# Patient Record
Sex: Male | Born: 1935 | Race: White | Hispanic: No | Marital: Married | Smoking: Never smoker
Health system: Southern US, Community
[De-identification: ages and names within clinical notes are randomized; demographics above are authoritative.]

## PROBLEM LIST (undated history)

## (undated) DIAGNOSIS — N200 Calculus of kidney: Secondary | ICD-10-CM

## (undated) DIAGNOSIS — Z972 Presence of dental prosthetic device (complete) (partial): Secondary | ICD-10-CM

## (undated) DIAGNOSIS — N201 Calculus of ureter: Secondary | ICD-10-CM

## (undated) DIAGNOSIS — Z860100 Personal history of colon polyps, unspecified: Secondary | ICD-10-CM

## (undated) DIAGNOSIS — R0609 Other forms of dyspnea: Secondary | ICD-10-CM

## (undated) DIAGNOSIS — Z974 Presence of external hearing-aid: Secondary | ICD-10-CM

## (undated) DIAGNOSIS — N400 Enlarged prostate without lower urinary tract symptoms: Secondary | ICD-10-CM

## (undated) DIAGNOSIS — Z9289 Personal history of other medical treatment: Secondary | ICD-10-CM

## (undated) DIAGNOSIS — R0600 Dyspnea, unspecified: Secondary | ICD-10-CM

## (undated) DIAGNOSIS — K5730 Diverticulosis of large intestine without perforation or abscess without bleeding: Secondary | ICD-10-CM

## (undated) DIAGNOSIS — I2510 Atherosclerotic heart disease of native coronary artery without angina pectoris: Secondary | ICD-10-CM

## (undated) DIAGNOSIS — E782 Mixed hyperlipidemia: Secondary | ICD-10-CM

## (undated) DIAGNOSIS — Z87442 Personal history of urinary calculi: Secondary | ICD-10-CM

## (undated) DIAGNOSIS — Z973 Presence of spectacles and contact lenses: Secondary | ICD-10-CM

## (undated) DIAGNOSIS — M1990 Unspecified osteoarthritis, unspecified site: Secondary | ICD-10-CM

## (undated) HISTORY — PX: CARDIAC CATHETERIZATION: SHX172

## (undated) HISTORY — DX: Mixed hyperlipidemia: E78.2

## (undated) HISTORY — DX: Calculus of kidney: N20.0

## (undated) HISTORY — DX: Unspecified osteoarthritis, unspecified site: M19.90

## (undated) HISTORY — DX: Personal history of colonic polyps: Z86.010

## (undated) HISTORY — DX: Atherosclerotic heart disease of native coronary artery without angina pectoris: I25.10

## (undated) HISTORY — DX: Personal history of colon polyps, unspecified: Z86.0100

## (undated) HISTORY — PX: TRANSTHORACIC ECHOCARDIOGRAM: SHX275

---

## 2000-09-17 HISTORY — PX: EXTRACORPOREAL SHOCK WAVE LITHOTRIPSY: SHX1557

## 2001-03-17 ENCOUNTER — Emergency Department (HOSPITAL_COMMUNITY): Admission: EM | Admit: 2001-03-17 | Discharge: 2001-03-17 | Payer: Self-pay | Admitting: *Deleted

## 2001-03-17 ENCOUNTER — Encounter: Payer: Self-pay | Admitting: *Deleted

## 2001-03-19 ENCOUNTER — Inpatient Hospital Stay (HOSPITAL_COMMUNITY): Admission: AD | Admit: 2001-03-19 | Discharge: 2001-03-20 | Payer: Self-pay | Admitting: Urology

## 2001-03-20 ENCOUNTER — Encounter: Payer: Self-pay | Admitting: Urology

## 2001-03-26 ENCOUNTER — Ambulatory Visit (HOSPITAL_COMMUNITY): Admission: RE | Admit: 2001-03-26 | Discharge: 2001-03-26 | Payer: Self-pay | Admitting: Urology

## 2001-03-26 ENCOUNTER — Encounter: Payer: Self-pay | Admitting: Urology

## 2001-04-04 ENCOUNTER — Ambulatory Visit (HOSPITAL_COMMUNITY): Admission: RE | Admit: 2001-04-04 | Discharge: 2001-04-04 | Payer: Self-pay | Admitting: Urology

## 2001-04-04 ENCOUNTER — Encounter: Payer: Self-pay | Admitting: Urology

## 2001-05-22 ENCOUNTER — Ambulatory Visit (HOSPITAL_COMMUNITY): Admission: RE | Admit: 2001-05-22 | Discharge: 2001-05-22 | Payer: Self-pay | Admitting: Urology

## 2001-05-22 ENCOUNTER — Encounter: Payer: Self-pay | Admitting: Urology

## 2001-06-03 ENCOUNTER — Ambulatory Visit (HOSPITAL_COMMUNITY): Admission: RE | Admit: 2001-06-03 | Discharge: 2001-06-03 | Payer: Self-pay | Admitting: General Surgery

## 2002-02-19 ENCOUNTER — Ambulatory Visit (HOSPITAL_COMMUNITY): Admission: RE | Admit: 2002-02-19 | Discharge: 2002-02-19 | Payer: Self-pay | Admitting: Family Medicine

## 2002-02-19 ENCOUNTER — Encounter: Payer: Self-pay | Admitting: Family Medicine

## 2002-02-27 ENCOUNTER — Ambulatory Visit (HOSPITAL_COMMUNITY): Admission: RE | Admit: 2002-02-27 | Discharge: 2002-02-27 | Payer: Self-pay | Admitting: Cardiology

## 2004-05-03 ENCOUNTER — Ambulatory Visit (HOSPITAL_COMMUNITY): Admission: RE | Admit: 2004-05-03 | Discharge: 2004-05-03 | Payer: Self-pay | Admitting: General Surgery

## 2006-06-04 ENCOUNTER — Ambulatory Visit (HOSPITAL_COMMUNITY): Admission: RE | Admit: 2006-06-04 | Discharge: 2006-06-04 | Payer: Self-pay | Admitting: Family Medicine

## 2008-08-18 ENCOUNTER — Ambulatory Visit (HOSPITAL_COMMUNITY): Admission: RE | Admit: 2008-08-18 | Discharge: 2008-08-18 | Payer: Self-pay | Admitting: Family Medicine

## 2010-07-18 ENCOUNTER — Encounter: Payer: Self-pay | Admitting: Cardiology

## 2010-10-16 ENCOUNTER — Ambulatory Visit (HOSPITAL_COMMUNITY)
Admission: RE | Admit: 2010-10-16 | Discharge: 2010-10-16 | Payer: Self-pay | Source: Home / Self Care | Attending: Urology | Admitting: Urology

## 2011-02-02 NOTE — H&P (Signed)
Jaime [name], Jaime [name]                            ACCOUNT NO.:  [id number]   MEDICAL RECORD NO.:  [id number]                   PATIENT TYPE:   LOCATION:                                       FACILITY:   PHYSICIAN:  Dalia Heading, M.D.               DATE OF BIRTH:  [birth year]   DATE OF ADMISSION:  DATE OF DISCHARGE:                                HISTORY & PHYSICAL   CHIEF COMPLAINT:  History of ascending colon polyp.   HISTORY OF PRESENT ILLNESS:  The patient is a 77 year old white male who is  referred for a followup colonoscopy.  He had a colonoscopy done in 2002  which revealed a ascending colon polyp which appeared to be benign.  He now  presents for a followup examination.  No new medical problems have been  noted.  He denies any hematochezia or melena.  There is no family history of  colon carcinoma.   PAST MEDICAL HISTORY:  Unremarkable.   PAST SURGICAL HISTORY:  As noted above.   CURRENT MEDICATIONS:  Lipitor.   ALLERGIES:  NO KNOWN DRUG ALLERGIES.   REVIEW OF SYSTEMS:  Noncontributory.   PHYSICAL EXAMINATION:  The patient is a well-developed, well-nourished white  male in no acute distress.  He is afebrile and vital signs are stable.  LUNGS:  Clear to auscultation with equal breath sounds bilaterally.  HEART:  Examination reveals a regular rate and rhythm without S3, S4, or  murmurs.  ABDOMEN:  Soft, nontender, nondistended.  No hepatosplenomegaly or masses  are noted.  RECTAL:  Examination was deferred to the procedure.   IMPRESSION:  History of colon polyps.   PLAN:  The patient is scheduled for a colonoscopy on May 03, 2004.  The  risks and benefits of the procedure including bleeding and perforation were  fully explained to the patient, gave informed consent.     ___________________________________________                                         Dalia Heading, M.D.   MAJ/MEDQ  D:  04/27/2004  T:  04/27/2004  Job:  [id number]   cc:   Patrica Duel,  M.D.  [address], [address]  [place]  [place] [address]  Fax: [phone]

## 2011-02-02 NOTE — Cardiovascular Report (Signed)
[place]  Patient:    Jaime [name], Jaime [name] Visit Number: [id number] MRN: [id number]          Service Type: CAT Location: [facility] 2856 [id number] Attending Physician:  Rollene Rotunda Dictated by:   Rollene Rotunda, M.D. [facility] Proc. Date: 02/27/02 Admit Date:  02/27/2002 Discharge Date: 02/27/2002   CC:         Jonell Cluck, M.D.  Thomas C. Daleen Squibb, M.D. [facility], [place], [place].   Cardiac Catheterization  DATE OF BIRTH:  [birth year]  PRIMARY CARE PHYSICIAN:  Jonell Cluck, M.D.  PROCEDURES:  Left heart catheterization/coronary arteriography.  INDICATIONS:  Evaluate patient with chest pain and progressive dyspnea on exertion with cardiovascular risk factors (786.51).  PROCEDURAL NOTE:  Left heart catheterization was performed via the right femoral artery.  The artery was cannulated using an anterior wall puncture.  A #6 French arterial sheath was inserted via the modified Seldinger technique. Preformed Judkins and a pigtail catheter were utilized.  The patient tolerated the procedure well and left the lab in stable condition.  RESULTS:  HEMODYNAMICS: 1. LV 118/12. 2. AO 112/62.  CORONARIES: 1. The left main was normal.  2. The LAD had diffuse luminal irregularities.  3. The circumflex was normal.  4. The right coronary artery was dominant and normal.  LEFT VENTRICULOGRAM:  A left ventriculogram was obtained in the RAO projection.  The EF was 65% with normal wall motion.  CONCLUSIONS:  Minimal coronary plaquing.  Normal left ventricular function.  PLAN:  No further cardiovascular workup is indicated.  The patient will follow up with Dr. Nobie Putnam for evaluation of his dyspnea. Dictated by:   Rollene Rotunda, M.D. [facility] Attending Physician:  Rollene Rotunda DD:  02/27/02 TD:  03/01/02 Job: [id number]

## 2011-05-16 ENCOUNTER — Ambulatory Visit (INDEPENDENT_AMBULATORY_CARE_PROVIDER_SITE_OTHER): Payer: 59 | Admitting: Cardiology

## 2011-05-16 ENCOUNTER — Encounter: Payer: Self-pay | Admitting: Cardiology

## 2011-05-16 DIAGNOSIS — R0602 Shortness of breath: Secondary | ICD-10-CM

## 2011-05-16 DIAGNOSIS — R42 Dizziness and giddiness: Secondary | ICD-10-CM

## 2011-05-16 DIAGNOSIS — I2510 Atherosclerotic heart disease of native coronary artery without angina pectoris: Secondary | ICD-10-CM

## 2011-05-16 DIAGNOSIS — R011 Cardiac murmur, unspecified: Secondary | ICD-10-CM

## 2011-05-16 DIAGNOSIS — E782 Mixed hyperlipidemia: Secondary | ICD-10-CM

## 2011-05-16 NOTE — Assessment & Plan Note (Signed)
2-D echocardiogram to be obtained to assess valvular structure and function.

## 2011-05-16 NOTE — Assessment & Plan Note (Signed)
On statin therapy, followed by Dr. Fusco. 

## 2011-05-16 NOTE — Progress Notes (Signed)
Clinical Summary [name] is a 77 y.o.male referred for cardiology consultation by Dr. Sherwood Gambler. He is here with his wife. Approximately one month ago he had an episode of near syncope while walking out to the mailbox. He states that he suddenly began to feel lightheaded without palpitations or chest pain, had to bend down to his knees to avoid passing out. His wife saw him from the window, and assisted him back to the house. He was reportedly nauseated after the event. He never had frank syncope, ultimately was back to baseline. He has had some brief episodes of what sound like orthostatic dizziness, no vertigo, but much less intense.  Otherwise he does describe dyspnea on exertion, however typically at higher levels of activity, NYHA class II at baseline. No exertional chest pain. He has a previously documented history of minor coronary atherosclerosis at catheterization 2003, no followup interval testing.  Followup ECG is reviewed below, overall nonspecific.   No Known Allergies  Medication list reviewed.  Past Medical History  Diagnosis Date  . Coronary atherosclerosis of native coronary artery     Minimal coronary atherosclerosis at catheterization 2003  . Mixed hyperlipidemia   . Osteoarthritis   . History of colonic polyps   . Nephrolithiasis     History reviewed. No pertinent past surgical history.  Family History  Problem Relation Age of Onset  . Cancer Father     Bone cancer    Social History [name] reports that he has never smoked. His smokeless tobacco use includes Chew. [name] reports that he does not drink alcohol.  Review of Systems Occasional "sinus headache," no obvious vertigo. No fevers or chills, no cough. Otherwise reviewed negative except as outlined.  Physical Examination Filed Vitals:   05/16/11 1352  BP: 133/70  Pulse: 70  Resp: 18  Normally nourished appearing male in no acute distress. HEENT: Conjunctiva and lids normal, oropharynx with  moist mucosa. Neck: Supple, no elevated JVP or carotid bruits, no thyromegaly. Lungs: Clear to auscultation, somewhat coarse breath sounds, nonlabored. Cardiac: Regular rate and rhythm with 2/6 systolic murmur heard at the base, no S3 gallop or rub. Abdomen: Soft, nontender, bowel sounds present. Skin: Warm and dry. Musculoskeletal: No kyphosis. Neuropsychiatric: Alert and oriented x3, mildly hard of hearing, affect appropriate.   ECG Normal sinus rhythm at 70 beats per minute with probable left atrial enlargement    Problem List and Plan

## 2011-05-16 NOTE — Patient Instructions (Signed)
Your physician recommends that you schedule a follow-up appointment in: after test we will call to discuss if follow up is needed  Your physician has recommended that you wear a holter monitor. Holter monitors are medical devices that record the heart's electrical activity. Doctors most often use these monitors to diagnose arrhythmias. Arrhythmias are problems with the speed or rhythm of the heartbeat. The monitor is a small, portable device. You can wear one while you do your normal daily activities. This is usually used to diagnose what is causing palpitations/syncope (passing out).  Your physician has requested that you have an exercise tolerance test. For further information please visit [email or web address] Please also follow instruction sheet, as given.  Your physician has requested that you have an echocardiogram. Echocardiography is a painless test that uses sound waves to create images of your heart. It provides your doctor with information about the size and shape of your heart and how well your heart's chambers and valves are working. This procedure takes approximately one hour. There are no restrictions for this procedure.

## 2011-05-16 NOTE — Assessment & Plan Note (Signed)
Minor coronary atherosclerosis noted at cardiac catheterization in 2003.

## 2011-05-16 NOTE — Assessment & Plan Note (Signed)
Not progressive, and noted mainly at higher levels of activity. No true exertional chest pain. In light of his other presenting complaints, a basic ischemic evaluation will be obtained via treadmill testing.

## 2011-05-16 NOTE — Assessment & Plan Note (Signed)
At this point etiology not clear. Almost sounds orthostatic, and patient is on Flomax which could be contributing. He was reportedly not objectively orthostatic at his primary care office visit however. He does not endorse any specific palpitations. Possibly an exertional component, although not consistently. He describes most of his transient dizziness occurring in the mornings, and the late evenings. We will further assess with a 24-hour Holter monitor.

## 2011-05-31 ENCOUNTER — Ambulatory Visit (HOSPITAL_COMMUNITY)
Admission: RE | Admit: 2011-05-31 | Discharge: 2011-05-31 | Disposition: A | Discharge status: Home / Self Care | Payer: Medicare HMO | Source: Ambulatory Visit | Attending: Cardiology | Admitting: Cardiology

## 2011-05-31 ENCOUNTER — Ambulatory Visit (INDEPENDENT_AMBULATORY_CARE_PROVIDER_SITE_OTHER): Payer: Medicare HMO | Admitting: *Deleted

## 2011-05-31 DIAGNOSIS — I359 Nonrheumatic aortic valve disorder, unspecified: Secondary | ICD-10-CM

## 2011-05-31 DIAGNOSIS — R42 Dizziness and giddiness: Secondary | ICD-10-CM

## 2011-05-31 DIAGNOSIS — R0609 Other forms of dyspnea: Secondary | ICD-10-CM | POA: Insufficient documentation

## 2011-05-31 DIAGNOSIS — R011 Cardiac murmur, unspecified: Secondary | ICD-10-CM

## 2011-05-31 DIAGNOSIS — E785 Hyperlipidemia, unspecified: Secondary | ICD-10-CM | POA: Insufficient documentation

## 2011-05-31 DIAGNOSIS — I2510 Atherosclerotic heart disease of native coronary artery without angina pectoris: Secondary | ICD-10-CM | POA: Insufficient documentation

## 2011-05-31 DIAGNOSIS — R0989 Other specified symptoms and signs involving the circulatory and respiratory systems: Secondary | ICD-10-CM | POA: Insufficient documentation

## 2011-05-31 DIAGNOSIS — R0602 Shortness of breath: Secondary | ICD-10-CM

## 2011-05-31 NOTE — Progress Notes (Addendum)
Stress Lab Nurses Notes - Jaime [name]  Jaime [name] 05/31/2011  Reason for doing test: Coronary artery disease and near syncope.  Type of test: Regular GTX  Nurse performing test: Parke Poisson, RN  MD performing test: R. Dietrich Pates  Family MD: Fusco  Test explained and consent signed: yes  IV started: No IV started  Symptoms: Fatigue  Treatment/Intervention: None  Reason test stopped: fatigue  Patient to return to Nuc. Med at : NA  Patient discharged: Home  Patient's Condition upon discharge was: stable  Comments: Peak BP 188/82 HR 155, recovery  BP 148/60 & HR 96.  Symptoms resolved in recovery.    [name] T  Graded Exercise Test-Interpretation      Graded exercise performed to a work load of 8.6 METs and a heart rate of 157, 108% of age-predicted maximum.  Exercise discontinued due to dyspnea and fatigue; no chest discomfort reported.  Blood pressure increased from a resting value of 135/72 to 190/80 at peak exercise.  No arrhythmias noted.  EKG: Normal sinus rhythm; left atrial abnormality; occasional fusion beat; otherwise normal. Stress EKG:  ST segment depression starting in the first minute of stage II of the protocol and progressing to a maximum of 1.5-2 mm of slowly upsloping and flat depression in the inferior leads as well as V4-V6.  ST segments normalized immediately after the cessation of exercise.  Impression: Adequate but abnormal graded exercise test revealing reasonably good exercise capacity, no chest discomfort or other ischemic symptoms, a borderline hypertensive blood pressure response and an electrocardiographic response consistent with myocardial ischemia, but with very rapid resolution after the cessation of exercise suggesting a possible false positive electrocardiographic response.  Other findings as noted.  [place] Bing, M.D.

## 2011-05-31 NOTE — Progress Notes (Signed)
*  PRELIMINARY RESULTS* Echocardiogram 2D Echocardiogram has been performed.  Jaime [name] 05/31/2011, 10:10 AM

## 2011-05-31 NOTE — Progress Notes (Signed)
*  PRELIMINARY RESULTS* Echocardiogram 24H Holter Monitor has been performed.  Jaime [name] 05/31/2011, 12:26 PM

## 2011-06-05 NOTE — Procedures (Signed)
[name], Jaime                ACCOUNT NO.:  [id number]  MEDICAL RECORD NO.:  [id number]  LOCATION:                                 FACILITY:  PHYSICIAN:  Gerrit Friends. Dietrich Pates, MD, FACCDATE OF BIRTH:  02-Jul-1933  DATE OF PROCEDURE: DATE OF DISCHARGE:  06/01/2011                               HOLTER MONITOR   REFERRING PHYSICIAN:  Jonelle Sidle, MD  CLINICAL DATA:  A 77 year old gentleman with palpitations. 1. Continuous electrocardiographic recording was maintained for 24     hours during which the predominant rhythm was normal sinus with     minimal sinus tachycardia reaching a maximal rate of 112 bpm. 2. No PVCs nor other significant arrhythmias were recorded. 3. Occasional PACs occurred at an average rate of 10 per hour. 4. No significant ST-segment elevation or depression was identified. 5. A diary of activity was returned, but no symptoms were reported.     Gerrit Friends. Dietrich Pates, MD, [facility]     RMR/MEDQ  D:  06/05/2011  T:  06/05/2011  Job:  [id number]

## 2012-02-29 ENCOUNTER — Other Ambulatory Visit (HOSPITAL_COMMUNITY): Payer: Self-pay | Admitting: Internal Medicine

## 2012-02-29 DIAGNOSIS — K7689 Other specified diseases of liver: Secondary | ICD-10-CM

## 2012-03-05 ENCOUNTER — Ambulatory Visit (HOSPITAL_COMMUNITY)
Admission: RE | Admit: 2012-03-05 | Discharge: 2012-03-05 | Disposition: A | Discharge status: Home / Self Care | Payer: Medicare Other | Source: Ambulatory Visit | Attending: Internal Medicine | Admitting: Internal Medicine

## 2012-03-05 DIAGNOSIS — R748 Abnormal levels of other serum enzymes: Secondary | ICD-10-CM | POA: Insufficient documentation

## 2012-03-05 DIAGNOSIS — K7689 Other specified diseases of liver: Secondary | ICD-10-CM | POA: Insufficient documentation

## 2012-03-12 ENCOUNTER — Telehealth: Payer: Self-pay

## 2012-03-12 NOTE — Telephone Encounter (Signed)
A message had been left to call pt to schedule appt. [facility] supposed had sent a referral. I have not received it. I called and LMOM for [facility] to fax the referral to me.) [facility] for pt to call to be triaged.

## 2012-03-13 ENCOUNTER — Encounter (INDEPENDENT_AMBULATORY_CARE_PROVIDER_SITE_OTHER): Payer: Self-pay | Admitting: *Deleted

## 2012-03-13 NOTE — Telephone Encounter (Signed)
Called [facility] to inquire about the referral. They said that the referral was sent to Dr. Karilyn Cota per pt request.

## 2012-03-19 ENCOUNTER — Encounter (INDEPENDENT_AMBULATORY_CARE_PROVIDER_SITE_OTHER): Payer: Self-pay | Admitting: *Deleted

## 2012-03-25 ENCOUNTER — Encounter (INDEPENDENT_AMBULATORY_CARE_PROVIDER_SITE_OTHER): Payer: Self-pay | Admitting: Internal Medicine

## 2012-03-25 ENCOUNTER — Ambulatory Visit (INDEPENDENT_AMBULATORY_CARE_PROVIDER_SITE_OTHER): Payer: Medicare Other | Admitting: Internal Medicine

## 2012-03-25 VITALS — BP 152/58 | HR 80 | Temp 97.7°F | Ht 68.0 in | Wt 168.6 lb

## 2012-03-25 DIAGNOSIS — R17 Unspecified jaundice: Secondary | ICD-10-CM

## 2012-03-25 NOTE — Progress Notes (Signed)
Subjective:     Patient ID: Jaime [name], male   DOB: 1932/12/16, 78 y.o.   MRN: [id number]  HPI Referred to our office for elevated bilirubin. He was seen by Dr. Phillips Odor for muscle cramp and pain in his legs. Presently being evaluated by [facility].  Cmet revealed an elevated bilirubin 2.1 with normal AST and ALT.  Appetite is okay.  Weight loss 10-12 pounds since May unintentional. No abdominal pain.  He denies jaundice. He has a BM about every other day or every 2-3 days. No melena or bright red rectal bleeding. 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 No abnormality identified within the  visualized portions.   Review of Systems Current Outpatient Prescriptions  Medication Sig Dispense Refill  . aspirin 81 MG tablet Take 81 mg by mouth daily.        . calcium-vitamin D (OSCAL WITH D) 500-200 MG-UNIT per tablet Take 1 tablet by mouth daily.        . Cholecalciferol (VITAMIN D-3 PO) Take 1,200 Units by mouth daily.        . fish oil-omega-3 fatty acids 1000 MG capsule Take 2 g by mouth daily.        . multivitamin (ONE-A-DAY MEN'S) TABS Take 1 tablet by mouth daily.        . simvastatin (ZOCOR) 40 MG tablet       . Tamsulosin HCl (FLOMAX) 0.4 MG CAPS        Past Medical History  Diagnosis Date  . Coronary atherosclerosis of native coronary artery     Minimal coronary atherosclerosis at catheterization 2003  . Mixed hyperlipidemia   . Osteoarthritis   . History of colonic polyps   . Nephrolithiasis    History reviewed. No pertinent past surgical history. History   Social History  . Marital Status: Married    Spouse Name: Jaime [name]    Number of Children: 3  . Years of Education: N/A   Occupational History  . Retired     Naval architect   Social History Main Topics  . Smoking  status: Never Smoker   . Smokeless tobacco: Current User    Types: Chew  . Alcohol Use: No  . Drug Use: No  . Sexually Active: Not on file   Other Topics Concern  . Not on file   Social History Narrative  . No narrative on file   Family Status  Relation Status Death Age  . Father Deceased     Bone cancer  . Mother Deceased     CAD age 56  . Sister Alive     good health   No Known Allergies     Objective:   Physical Exam Filed Vitals:   03/25/12 1539  Height: 5\' 8"  (1.727 m)  Weight: 168 lb 9.6 oz (76.476 kg)  Alert and oriented. Skin warm and dry. Oral mucosa is moist.   . Sclera anicteric, conjunctivae is pink. Thyroid not enlarged. No cervical lymphadenopathy. Lungs clear. Heart regular rate and rhythm.  Abdomen is soft. Bowel sounds are positive. No hepatomegaly. No abdominal masses felt. No tenderness.  No edema to lower extremities.        Assessment:    Elevated bilirubin. Suspect Gilbert's syndrome. Bilirubin elevated in 2011 at 1.9.  I discussed this case with  Dr. Karilyn Cota,    Plan:   Hepatic function. Further recommendations to follow.     CC Dr. Sherwood Gambler

## 2012-03-25 NOTE — Patient Instructions (Addendum)
Repeat Hepatic function. Further recommendations to follow once labs are back.

## 2012-03-26 ENCOUNTER — Ambulatory Visit (HOSPITAL_COMMUNITY)
Admission: RE | Admit: 2012-03-26 | Discharge: 2012-03-26 | Disposition: A | Discharge status: Home / Self Care | Payer: Medicare Other | Source: Ambulatory Visit | Attending: Physical Medicine and Rehabilitation | Admitting: Physical Medicine and Rehabilitation

## 2012-03-26 ENCOUNTER — Telehealth (INDEPENDENT_AMBULATORY_CARE_PROVIDER_SITE_OTHER): Payer: Self-pay | Admitting: *Deleted

## 2012-03-26 DIAGNOSIS — R262 Difficulty in walking, not elsewhere classified: Secondary | ICD-10-CM | POA: Insufficient documentation

## 2012-03-26 DIAGNOSIS — IMO0001 Reserved for inherently not codable concepts without codable children: Secondary | ICD-10-CM | POA: Insufficient documentation

## 2012-03-26 DIAGNOSIS — M545 Low back pain, unspecified: Secondary | ICD-10-CM | POA: Insufficient documentation

## 2012-03-26 DIAGNOSIS — M6281 Muscle weakness (generalized): Secondary | ICD-10-CM | POA: Insufficient documentation

## 2012-03-26 DIAGNOSIS — R7401 Elevation of levels of liver transaminase levels: Secondary | ICD-10-CM

## 2012-03-26 LAB — HEPATIC FUNCTION PANEL
ALT: 41 U/L (ref 0–53)
Alkaline Phosphatase: 59 U/L (ref 39–117)
Indirect Bilirubin: 0.8 mg/dL (ref 0.0–0.9)
Total Protein: 6.7 g/dL (ref 6.0–8.3)

## 2012-03-26 NOTE — Telephone Encounter (Signed)
Per Delrae Rend the patient will need to have Hepatic Profile in 3 weeks. Noted for July 31,2013.

## 2012-03-26 NOTE — Evaluation (Signed)
Physical Therapy Evaluation  Patient Details  Name: Jaime [name] MRN: [id number] Date of Birth: 07-07-1933  Today's Date: 03/26/2012 Time: 1310-1350 PT Time Calculation (min): 40 min  Visit#: 1  of 8   Re-eval:  04/25/2012 Assessment Diagnosis:  (radicular low back pain) Next MD Visit: 04/11/2012 Prior Therapy: none  Authorization: [employer] medic[employer]  Past Medical History:  Past Medical History  Diagnosis Date  . Coronary atherosclerosis of native coronary artery     Minimal coronary atherosclerosis at catheterization 2003  . Mixed hyperlipidemia   . Osteoarthritis   . History of colonic polyps   . Nephrolithiasis    Past Surgical History: No past surgical history on file.  Subjective Symptoms/Limitations Symptoms: Jaime [name] has had difficutly with back pain that radiates to the mid calf level that started in May.  He has been referred to physical therapy to try and resolve his pain and improve his functional ability.   How long can you sit comfortably?: The patient has no difficulty sitting. How long can you stand comfortably?: The patient states that he is has increased pain after just a minute of standing. How long can you walk comfortably?: The patient has began using a cane since May.  He states that he is able to walk for less than five minutes; anymore and he begins to have increased pain. Special Tests: No difficulty sleeping. Pain Assessment Currently in Pain?: Yes Pain Score:  (worst pain in the past week has been a 6/10 best 0/10) Pain Location: Back Pain Orientation: Right Pain Type: Chronic pain;Neuropathic pain Pain Onset: More than a month ago Pain Frequency: Intermittent Pain Relieving Factors: sitting  Effect of Pain on Daily Activities: increases pain   Prior Functioning  Home Living Lives With: Spouse Type of Home: House Home Access: Stairs to enter Secretary/administrator of Steps: 2 Prior Function Driving: Yes Vocation: Retired Leisure:  Hobbies-yes (Comment) Comments: mowing yards.  Pt was using a walking mowing now he uses a  Chief Strategy Officer due to increased pain when walking.  Cognition/Observation Cognition Overall Cognitive Status: Appears within functional limits for tasks assessed Arousal/Alertness: Awake/alert   Assessment RLE Strength Right Hip Flexion: 5/5 Right Hip Extension: 3+/5 Right Hip ABduction: 5/5 Right Hip ADduction: 5/5 Right Knee Flexion: 5/5 Right Knee Extension: 5/5 Right Ankle Dorsiflexion: 5/5 LLE Strength Left Hip Flexion: 5/5 Left Hip Extension: 3+/5 Left Hip ABduction: 5/5 Left Hip ADduction: 5/5 Left Knee Flexion: 5/5 Left Knee Extension: 5/5 Left Ankle Dorsiflexion: 5/5 Lumbar AROM Lumbar Flexion: decreased 25% pain upon return Lumbar Extension: wfl increased leg pain Lumbar - Right Side Bend: wnl increased R leg pain Lumbar - Left Side Bend: wnl slight increased pain. Lumbar - Right Rotation: wnl Lumbar - Left Rotation: decreased 20 %  Exercise/Treatments Mobility/Balance  Posture/Postural Control Posture/Postural Control: Postural limitations Postural Limitations: flat back   Stretches Active Hamstring Stretch: 3 reps;30 seconds Single Knee to Chest Stretch: 3 reps;30 seconds   Supine Ab Set: 10 reps Bridge: 10 reps   Physical Therapy Assessment and Plan PT Assessment and Plan Clinical Impression Statement: Pt with low back and R radicular sx to calf level who will benefit from skilled PT to relieve his symptoms and return him to prior functional level. Rehab Potential: Good PT Frequency: Min 2X/week PT Duration: 4 weeks PT Treatment/Interventions: Therapeutic exercise;Gait training;Modalities PT Plan: Next treatment begin trunk rotation, Prone extension and mechanical traction at max 50% body weight; min 30# less, hold 45 relax 15 x 15  Goals Home Exercise Program Pt will Perform Home Exercise Program: Independently PT Short Term Goals Time to Complete  Short Term Goals: 2 weeks PT Short Term Goal 1: no leg pain PT Short Term Goal 2: ROM wnl to allow normalized motion PT Long Term Goals Time to Complete Long Term Goals: 4 weeks PT Long Term Goal 1: Pt ambulating without cane PT Long Term Goal 2: Pt hip extension strength to be wnl Long Term Goal 3: Pt pain to be no more than a 1 throughout the day Long Term Goal 4: Pt to be able to walk for over an hour without increased pain  Problem List Patient Active Problem List  Diagnosis  . Shortness of breath  . Undiagnosed cardiac murmurs  . Dizziness and giddiness  . Mixed hyperlipidemia  . Coronary atherosclerosis of native coronary artery  . Elevated bilirubin  . Difficulty in walking    PT - End of Session Activity Tolerance: Patient tolerated treatment well General Behavior During Session: [facility] for tasks performed Cognition: [facility] for tasks performed PT Plan of Care PT Home Exercise Plan: given Consulted and Agree with Plan of Care: Patient    Jaime [name] 03/26/2012, 2:00 PM  Physician Documentation Your signature is required to indicate approval of the treatment plan as stated above.  Please sign and either send electronically or make a copy of this report for your files and return this physician signed original.   Please mark one 1.__approve of plan  2. ___approve of plan with the following conditions.   ______________________________                                                          _____________________ Physician Signature                                                                                                             Date

## 2012-04-01 ENCOUNTER — Ambulatory Visit (HOSPITAL_COMMUNITY)
Admission: RE | Admit: 2012-04-01 | Discharge: 2012-04-01 | Disposition: A | Discharge status: Home / Self Care | Payer: Medicare Other | Source: Ambulatory Visit | Attending: Internal Medicine | Admitting: Internal Medicine

## 2012-04-01 NOTE — Progress Notes (Signed)
Physical Therapy Treatment Patient Details  Name: Jaime [name] MRN: [id number] Date of Birth: 1933-08-29  Today's Date: 04/01/2012 Time: 1100-1149 PT Time Calculation (min): 49 min 24' therex 15' lumbar traction  Visit#: 2  of 8   Re-eval:      Authorization:    Authorization Time Period:    Authorization Visit#:   of     Subjective: Symptoms/Limitations Symptoms: Mr. Dowland c/o of 4/10 pain in R gluteal area radiating to knee. Patient did say that sometimes pain radiates to mid calf so today is a good day.  Precautions/Restrictions     Exercise/Treatments Mobility/Balance        Stretches Active Hamstring Stretch: 3 reps;30 seconds Single Knee to Chest Stretch: 3 reps;30 seconds Aerobic   Machines for Strengthening   Standing   Seated   Supine Ab Set: 10 reps Bridge: 10 reps Other Supine Lumbar Exercises: trunk rotation x 5 Sidelying   Prone  Other Prone Lumbar Exercises: prone extension x5 Quadruped    Modalities Modalities: Traction Traction Type of Traction: Lumbar Min (lbs): 45 Max (lbs): 75 Hold Time: 45" Rest Time: 15" Time: 15'  Physical Therapy Assessment and Plan PT Assessment and Plan Clinical Impression Statement: Patient completed all exercises well, including new ones, some requiring verbal and tactile cueing for proper technique. Patient did well with traction. radiating pain still present at end of trmt but decreased to 3/10.    Goals    Problem List Patient Active Problem List  Diagnosis  . Shortness of breath  . Undiagnosed cardiac murmurs  . Dizziness and giddiness  . Mixed hyperlipidemia  . Coronary atherosclerosis of native coronary artery  . Elevated bilirubin  . Difficulty in walking    General Behavior During Session: [facility] for tasks performed Cognition: [facility] for tasks performed  GP    Jaime [name] 04/01/2012, 11:53 AM

## 2012-04-02 ENCOUNTER — Ambulatory Visit (HOSPITAL_COMMUNITY)
Admission: RE | Admit: 2012-04-02 | Discharge: 2012-04-02 | Disposition: A | Discharge status: Home / Self Care | Payer: Medicare Other | Source: Ambulatory Visit | Attending: Internal Medicine | Admitting: Internal Medicine

## 2012-04-02 NOTE — Progress Notes (Addendum)
Physical Therapy Treatment Patient Details  Name: ROLLIN KOTOWSKI MRN: [id number] Date of Birth: 03-17-1933  Today's Date: 04/02/2012 Time: 0865-7846 PT Time Calculation (min): 38 min 15' therex 15' lumbar traction  Visit#: 3  of 8   Re-eval:      Authorization:    Authorization Time Period:    Authorization Visit#:   of     Subjective: Symptoms/Limitations Symptoms: Pt reports no pain at the moment. Pain is usually highest in the morning and them intermittent throughout the day radiating to calf RLE.  Precautions/Restrictions     Exercise/Treatments Active Hamstring Stretch: 3 reps;30 seconds Single Knee to Chest Stretch: 3 reps;30 seconds Ab Set: 10 reps Bridge: 10 reps     Modalities Modalities: Traction Traction Type of Traction: Lumbar Min (lbs): 50 Max (lbs): 80 Hold Time: 45 Rest Time: 15 Time: 15'  Physical Therapy Assessment and Plan PT Assessment and Plan Clinical Impression Statement: Patient tolerated increase in traction today ending treatment with no pain as he started. Exercises need verbal cueing for proper technique. Patient requested to leave session a few mins early due having an appt afterwards. Next treatment add additional lumbar stabilization exercises.  Goals    Problem List Patient Active Problem List  Diagnosis  . Shortness of breath  . Undiagnosed cardiac murmurs  . Dizziness and giddiness  . Mixed hyperlipidemia  . Coronary atherosclerosis of native coronary artery  . Elevated bilirubin  . Difficulty in walking    PT - End of Session Activity Tolerance: Patient tolerated treatment well General Behavior During Session: [facility] for tasks performed Cognition: [facility] for tasks performed  GP    Yarielys Beed ATKINSO 04/02/2012, 1:55 PM

## 2012-04-04 ENCOUNTER — Encounter (INDEPENDENT_AMBULATORY_CARE_PROVIDER_SITE_OTHER): Payer: Self-pay

## 2012-04-08 ENCOUNTER — Ambulatory Visit (HOSPITAL_COMMUNITY)
Admission: RE | Admit: 2012-04-08 | Discharge: 2012-04-08 | Disposition: A | Discharge status: Home / Self Care | Payer: Medicare Other | Source: Ambulatory Visit | Attending: Internal Medicine | Admitting: Internal Medicine

## 2012-04-08 DIAGNOSIS — R262 Difficulty in walking, not elsewhere classified: Secondary | ICD-10-CM

## 2012-04-08 NOTE — Progress Notes (Signed)
Physical Therapy Treatment Patient Details  Name: Jaime [name] MRN: [id number] Date of Birth: 10/20/1932  Today's Date: 04/08/2012 Time: 1300-1347 PT Time Calculation (min): 47 min  Visit#: 4  of 8   Re-eval: 04/22/12    Authorization:    Authorization Time Period:    Authorization Visit#:   of     Subjective: Symptoms/Limitations Symptoms: Pt states he is not having anymore leg pain.  In the mornings he has some hip pain that might take an hour or two to work out.  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 Pain Assessment Currently in Pain?: No/denies    Exercise/Treatments                                Stretches Prone on Elbows Stretch: 1 rep;60 seconds    Standing Scapular Retraction: Both;10 reps;Theraband Theraband Level (Scapular Retraction): Level 3 (Green) Row: Both;10 reps;Theraband Theraband Level (Row): Level 3 (Green) Shoulder Extension: Both;10 reps;Theraband   Supine Bridge: 15 reps Prone  Straight Leg Raise: 10 reps Other Prone Lumbar Exercises: heelsqueeze x10    Modalities Modalities: Traction Traction Type of Traction: Lumbar Min (lbs): 50 Max (lbs): 80 Hold Time: 45 Rest Time: 15 Time: 15  Physical Therapy Assessment and Plan PT Assessment and Plan Clinical Impression Statement: Pt pain continues to improve.  Added prone ex for both improved lumbar/hip ext strength and mobility of lumbar spine.   Rehab Potential: Good PT Plan: Pt to begin opposite arm/leg raise, body mechanics for lifting next treatment.    Goals    Problem List Patient Active Problem List  Diagnosis  . Shortness of breath  . Undiagnosed cardiac murmurs  . Dizziness and giddiness  . Mixed hyperlipidemia  . Coronary atherosclerosis of native coronary artery  . Elevated bilirubin  . Difficulty in walking    PT - End of Session Activity Tolerance: Patient tolerated treatment well General Behavior During Session: [facility] for tasks performed  GP     Glynn Yepes,CINDY 04/08/2012, 1:34 PM

## 2012-04-10 ENCOUNTER — Ambulatory Visit (HOSPITAL_COMMUNITY)
Admission: RE | Admit: 2012-04-10 | Discharge: 2012-04-10 | Disposition: A | Discharge status: Home / Self Care | Payer: Medicare Other | Source: Ambulatory Visit | Attending: Physical Medicine and Rehabilitation | Admitting: Physical Medicine and Rehabilitation

## 2012-04-10 NOTE — Progress Notes (Signed)
Physical Therapy Treatment Patient Details  Name: Jaime [name] MRN: [id number] Date of Birth: 1933-03-09  Today's Date: 04/10/2012 Time: 4098-1191 PT Time Calculation (min): 50 min  Visit#: 5  of 8   Re-eval: 04/22/12 Charges: Therex x 28' Traction x 17'  Subjective: Symptoms/Limitations Symptoms: Pt states that he has sporadic pain R hip but over all his pain is decreasing. Pain Assessment Currently in Pain?: No/denies (Currently) Pain Score:  (Has sporadic hip pain that pt rates at 3/10)   Exercise/Treatments Standing Functional Squats: 10 reps Scapular Retraction: 15 reps Theraband Level (Scapular Retraction): Level 3 (Green) Row: 15 reps Theraband Level (Row): Level 3 (Green) Shoulder Extension: 15 reps;Theraband Theraband Level (Shoulder Extension): Level 3 (Green)  Modalities Modalities: Traction Traction Type of Traction: Lumbar Min (lbs): 50 Max (lbs): 80 Hold Time: 45 Rest Time: 15 Time: 15 (17')  Physical Therapy Assessment and Plan PT Assessment and Plan Clinical Impression Statement: Pt requires multimodal cueing for proper form with scapular tband exercise. Began functional squats with education on proper body mechanics. Also began opposite arm/leg raise in prone with minimal difficulty and minimal need for cueing. Pt reports 0/10 pain at end of session. Rehab Potential: Good PT Plan: Continue to progress per PT POC.     Problem List Patient Active Problem List  Diagnosis  . Shortness of breath  . Undiagnosed cardiac murmurs  . Dizziness and giddiness  . Mixed hyperlipidemia  . Coronary atherosclerosis of native coronary artery  . Elevated bilirubin  . Difficulty in walking    PT - End of Session Activity Tolerance: Patient tolerated treatment well General Behavior During Session: [name] for tasks performed   [name], PTA 04/10/2012, 2:45 PM

## 2012-04-15 ENCOUNTER — Ambulatory Visit (HOSPITAL_COMMUNITY)
Admission: RE | Admit: 2012-04-15 | Discharge: 2012-04-15 | Disposition: A | Discharge status: Home / Self Care | Payer: Medicare Other | Source: Ambulatory Visit | Attending: Internal Medicine | Admitting: Internal Medicine

## 2012-04-15 DIAGNOSIS — R262 Difficulty in walking, not elsewhere classified: Secondary | ICD-10-CM

## 2012-04-15 NOTE — Evaluation (Signed)
Physical Therapy Treatment w/MMT and ROM Patient Details  Name: TEAGAN HEIDRICK MRN: [id number] Date of Birth: 15-May-1933  Today's Date: 04/15/2012 Time: 2951-8841 PT Time Calculation (min): 40 min Charges: 1 ROM, 1 MMT, 5' Self Care, 71' TE Visit#: 6  of 8   Re-eval: 04/22/12   Subjective Symptoms/Limitations Symptoms: I am feeling a lot better with less pain down the back of my leg.  I am not sure if it is the exercises or if it is just getting better on its own.  Pain Assessment Currently in Pain?: No/denies  Assessment RLE Strength Right Hip Extension: 4/5 (was 3+/5) LLE Strength Left Hip Extension: 5/5 (was 3+/5) Lumbar AROM Lumbar Flexion: WNL - no pain (decreased 25% pain upon return) Lumbar Extension: WNL - no pain (was wfl increased leg pain) Lumbar - Right Side Bend: WNL - No pain (was wnl increased R leg pain) Lumbar - Left Side Bend: WNL no pain (was wnl slight increased pain.) Lumbar - Right Rotation: WNL (wnl) Lumbar - Left Rotation: WNL  (was decreased 20 %)  Exercise/Treatments Stretches Active Hamstring Stretch: 3 reps;30 seconds (BLE) Single Knee to Chest Stretch: 3 reps;30 seconds (BLE) Double Knee to Chest Stretch: Other (comment) (10 reps 5 sec holds) Prone on Elbows Stretch: 1 rep;60 seconds Standing Functional Squats: 15 reps Forward Lunge: 5 reps;Limitations (BLE) Scapular Retraction: 15 reps Theraband Level (Scapular Retraction): Level 4 (Blue) Row: 15 reps Theraband Level (Row): Level 4 (Blue) Shoulder Extension: 15 reps Theraband Level (Shoulder Extension): Level 4 (Blue) Shoulder ADduction: AROM;Both;15 reps;Theraband Theraband Level (Shoulder Adduction): Level 4 (Blue) Other Standing Lumbar Exercises: R and L lateral side bending x5 each, back extension x10, Golf lifts 3x BLE Supine Ab Set: 5 reps Bent Knee Raise: 10 reps Bridge: 15 reps;5 seconds Prone  Single Arm Raise: Right;Left;5 reps Straight Leg Raise: 5 reps (R and L) Opposite  Arm/Leg Raise: Right arm/Left leg;Left arm/Right leg;5 reps Other Prone Lumbar Exercises: heelsqueeze 5x10  Physical Therapy Assessment and Plan PT Assessment and Plan Clinical Impression Statement: Re-assessed strength and ROM.  D/C lumbar traction today secondary to 0/10 pain or radiculopathy.  Discussed with patient if he remains pain free for next session will consider D/C secondary to currently 4/6 goals met.  Reviewed and updated HEP and added new exercises to encourage stability.  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   Rehab Potential: Good PT Plan: Consider D/C next visit if pt continues to have 0/10 rating for radicular pain w/o lumbar traction. Please provide w/blue tband and tband exercises    Goals Home Exercise Program Pt will Perform Home Exercise Program: Independently PT Goal: Perform Home Exercise Program - Progress: Partly met PT Short Term Goals Time to Complete Short Term Goals: 2 weeks PT Short Term Goal 1: no leg pain PT Short Term Goal 1 - Progress: Met PT Short Term Goal 2: ROM wnl to allow normalized motion PT Short Term Goal 2 - Progress: Met PT Long Term Goals Time to Complete Long Term Goals: 4 weeks PT Long Term Goal 1: Pt ambulating without cane PT Long Term Goal 1 - Progress: Met PT Long Term Goal 2: Pt hip extension strength to be wnl PT Long Term Goal 2 - Progress: Progressing toward goal Long Term Goal 3: Pt pain to be no more than a 1 throughout the day Long Term Goal 3 Progress: Met  Problem List Patient Active Problem List  Diagnosis  .  Shortness of breath  . Undiagnosed cardiac murmurs  . Dizziness and giddiness  . Mixed hyperlipidemia  . Coronary atherosclerosis of native coronary artery  . Elevated bilirubin  . Difficulty in walking    PT - End of Session Activity Tolerance: Patient tolerated treatment well General Behavior During Session: [facility] for tasks performed PT Plan  of Care PT Home Exercise Plan: see updated document PT Patient Instructions: Educated to continue to engage core during manual activities at home. Educated and had pt demostrate  Consulted and Agree with Plan of Care: Patient  Annett Fabian, PT 04/15/2012, 12:08 PM

## 2012-04-17 ENCOUNTER — Ambulatory Visit (HOSPITAL_COMMUNITY)
Admission: RE | Admit: 2012-04-17 | Discharge: 2012-04-17 | Disposition: A | Discharge status: Home / Self Care | Payer: Medicare Other | Source: Ambulatory Visit | Attending: Internal Medicine | Admitting: Internal Medicine

## 2012-04-17 DIAGNOSIS — M545 Low back pain, unspecified: Secondary | ICD-10-CM | POA: Insufficient documentation

## 2012-04-17 DIAGNOSIS — IMO0001 Reserved for inherently not codable concepts without codable children: Secondary | ICD-10-CM | POA: Insufficient documentation

## 2012-04-17 DIAGNOSIS — M6281 Muscle weakness (generalized): Secondary | ICD-10-CM | POA: Insufficient documentation

## 2012-04-17 DIAGNOSIS — R262 Difficulty in walking, not elsewhere classified: Secondary | ICD-10-CM | POA: Insufficient documentation

## 2012-04-17 NOTE — Progress Notes (Signed)
Physical Therapy Treatment Patient Details  Name: Jaime [name] MRN: [id number] Date of Birth: 1933-05-26  Today's Date: 04/17/2012 Time: 4098-1191 PT Time Calculation (min): 37 min  Visit#: 7  of 8   Re-eval: 04/22/12 Charges: Therex x 34'   Subjective: Symptoms/Limitations Symptoms: Pt reprots no radicular sx. Pain Assessment Currently in Pain?: No/denies Pain Score: 0-No pain  Objective: RLE Strength  Right Hip Extension: 4/5 (was 3+/5)  LLE Strength  Left Hip Extension: 5/5 (was 3+/5)  Lumbar AROM  Lumbar Flexion: WNL - no pain (decreased 25% pain upon return)  Lumbar Extension: WNL - no pain (was wfl increased leg pain)  Lumbar - Right Side Bend: WNL - No pain (was wnl increased R leg pain)  Lumbar - Left Side Bend: WNL no pain (was wnl slight increased pain.)  Lumbar - Right Rotation: WNL (wnl)  Lumbar - Left Rotation: WNL (was decreased 20 %) (Taken last session)  Exercise/Treatments Stretches Active Hamstring Stretch: 3 reps;30 seconds (BLE) Single Knee to Chest Stretch: 3 reps;30 seconds (BLE) Double Knee to Chest Stretch: Other (comment) (10x5") Standing Functional Squats: 15 reps Forward Lunge: 10 reps (BLE) Scapular Retraction: 15 reps Theraband Level (Scapular Retraction): Level 4 (Blue) Row: 15 reps Theraband Level (Row): Level 4 (Blue) Shoulder Extension: 15 reps Theraband Level (Shoulder Extension): Level 4 (Blue) Prone  Single Arm Raise: 10 reps Straight Leg Raise: 10 reps Opposite Arm/Leg Raise: 5 reps Other Prone Lumbar Exercises: heelsqueeze 5x10  Physical Therapy Assessment and Plan PT Assessment and Plan Clinical Impression Statement: Pt is without complaint throughout session. Radicular sx have subsided. Pt completes all therex without difficulty and minimal need for cueing. Pt comfortable with D/C to HEP. PT Plan: Recommend D/C to HEP.    Goals Home Exercise Program Pt will Perform Home Exercise Program: Independently PT Goal:  Perform Home Exercise Program - Progress: Met PT Short Term Goals Time to Complete Short Term Goals: 2 weeks PT Short Term Goal 1: no leg pain PT Short Term Goal 1 - Progress: Met PT Short Term Goal 2: ROM wnl to allow normalized motion PT Short Term Goal 2 - Progress: Met PT Long Term Goals Time to Complete Long Term Goals: 4 weeks PT Long Term Goal 1: Pt ambulating without cane PT Long Term Goal 1 - Progress: Met PT Long Term Goal 2: Pt hip extension strength to be wnl PT Long Term Goal 2 - Progress: Progressing toward goal Long Term Goal 3: Pt pain to be no more than a 1 throughout the day Long Term Goal 3 Progress: Met Long Term Goal 4: Pt to be able to walk for over an hour without increased pain  Long Term Goal 4 Progress: Met  Problem List Patient Active Problem List  Diagnosis  . Shortness of breath  . Undiagnosed cardiac murmurs  . Dizziness and giddiness  . Mixed hyperlipidemia  . Coronary atherosclerosis of native coronary artery  . Elevated bilirubin  . Difficulty in walking    PT - End of Session Activity Tolerance: Patient tolerated treatment well General Behavior During Session: [facility] for tasks performed   [name], PTA 04/17/2012, 2:24 PM

## 2012-04-22 ENCOUNTER — Ambulatory Visit (HOSPITAL_COMMUNITY): Payer: Medicare Other | Admitting: Physical Therapy

## 2012-04-24 ENCOUNTER — Ambulatory Visit (HOSPITAL_COMMUNITY): Payer: Medicare Other | Admitting: Physical Therapy

## 2012-04-28 ENCOUNTER — Encounter (INDEPENDENT_AMBULATORY_CARE_PROVIDER_SITE_OTHER): Payer: Medicare Other | Admitting: Internal Medicine

## 2012-04-28 ENCOUNTER — Other Ambulatory Visit (INDEPENDENT_AMBULATORY_CARE_PROVIDER_SITE_OTHER): Payer: Self-pay | Admitting: Internal Medicine

## 2012-04-29 ENCOUNTER — Telehealth (INDEPENDENT_AMBULATORY_CARE_PROVIDER_SITE_OTHER): Payer: Self-pay | Admitting: *Deleted

## 2012-04-29 DIAGNOSIS — E785 Hyperlipidemia, unspecified: Secondary | ICD-10-CM

## 2012-04-29 LAB — HEPATIC FUNCTION PANEL
ALT: 15 U/L (ref 0–53)
Albumin: 4.2 g/dL (ref 3.5–5.2)
Total Protein: 7 g/dL (ref 6.0–8.3)

## 2012-04-29 NOTE — Progress Notes (Signed)
Lab noted for November 13 Jaime [name] to make an appoinment

## 2012-04-29 NOTE — Telephone Encounter (Signed)
Per Dr.Rehman patient to have lab in 3 months

## 2012-04-30 ENCOUNTER — Telehealth (INDEPENDENT_AMBULATORY_CARE_PROVIDER_SITE_OTHER): Payer: Self-pay | Admitting: *Deleted

## 2012-04-30 NOTE — Telephone Encounter (Signed)
Open in error

## 2012-04-30 NOTE — Progress Notes (Signed)
Lab noted for 3 months  Patient needs office appointment for 3 months

## 2012-04-30 NOTE — Progress Notes (Signed)
Lab noted for 3 months Patient will need an appointment in 3 months Jaime [name])

## 2012-05-01 ENCOUNTER — Ambulatory Visit (INDEPENDENT_AMBULATORY_CARE_PROVIDER_SITE_OTHER): Payer: Medicare Other | Admitting: Internal Medicine

## 2012-05-13 NOTE — Telephone Encounter (Signed)
This has been addressed.

## 2012-06-12 NOTE — Patient Instructions (Addendum)
Patient was not actually seen

## 2012-06-12 NOTE — Progress Notes (Signed)
This encounter was created in error - please disregard.

## 2012-07-16 ENCOUNTER — Telehealth (INDEPENDENT_AMBULATORY_CARE_PROVIDER_SITE_OTHER): Payer: Self-pay | Admitting: *Deleted

## 2012-07-16 ENCOUNTER — Encounter (INDEPENDENT_AMBULATORY_CARE_PROVIDER_SITE_OTHER): Payer: Self-pay | Admitting: *Deleted

## 2012-07-16 DIAGNOSIS — E785 Hyperlipidemia, unspecified: Secondary | ICD-10-CM

## 2012-07-16 NOTE — Telephone Encounter (Signed)
Lab work due 

## 2012-07-30 LAB — HEPATIC FUNCTION PANEL
Bilirubin, Direct: 0.3 mg/dL (ref 0.0–0.3)
Total Bilirubin: 1.2 mg/dL (ref 0.3–1.2)

## 2012-08-04 ENCOUNTER — Ambulatory Visit (INDEPENDENT_AMBULATORY_CARE_PROVIDER_SITE_OTHER): Payer: Medicare Other | Admitting: Internal Medicine

## 2012-08-04 ENCOUNTER — Encounter (INDEPENDENT_AMBULATORY_CARE_PROVIDER_SITE_OTHER): Payer: Self-pay | Admitting: Internal Medicine

## 2012-08-04 DIAGNOSIS — R17 Unspecified jaundice: Secondary | ICD-10-CM

## 2012-08-04 NOTE — Progress Notes (Signed)
Subjective:     Patient ID: Jaime [name], male   DOB: 22-Sep-1932, 78 y.o.   MRN: [id number]  HPI   Feels fairly well.   Denies any jaundice. No abdominal pain.  His BMs are terrible sometimes. Sometimes he is constipated. No hx of etoh.  Appetite is good. H has actually gained 5 pounds since his last visit. Labs for this month were all normal.  Repeat labs by me in July were normal. Total bili was 1.0 02/28/2012 BUN 38, Creatinine 1.18, Total bilirubin 2.4, ALP 57, AST 25, ALT 29  07/18/10 Bili 1.9  Hepatic Function Panel     Component Value Date/Time   PROT 7.1 07/30/2012 0804   ALBUMIN 4.6 07/30/2012 0804   AST 22 07/30/2012 0804   ALT 21 07/30/2012 0804   ALKPHOS 61 07/30/2012 0804   BILITOT 1.2 07/30/2012 0804   BILIDIR 0.3 07/30/2012 0804   IBILI 0.9 07/30/2012 0804     Review of Systems see hpi Current Outpatient Prescriptions  Medication Sig Dispense Refill  . aspirin 81 MG tablet Take 81 mg by mouth daily.        . calcium-vitamin D (OSCAL WITH D) 500-200 MG-UNIT per tablet Take 1 tablet by mouth daily.        . Cholecalciferol (VITAMIN D-3 PO) Take 1,200 Units by mouth daily.        . fish oil-omega-3 fatty acids 1000 MG capsule Take 2 g by mouth daily.        . multivitamin (ONE-A-DAY MEN'S) TABS Take 1 tablet by mouth daily.        . simvastatin (ZOCOR) 40 MG tablet       . Tamsulosin HCl (FLOMAX) 0.4 MG CAPS        Past Medical History  Diagnosis Date  . Coronary atherosclerosis of native coronary artery     Minimal coronary atherosclerosis at catheterization 2003  . Mixed hyperlipidemia   . Osteoarthritis   . History of colonic polyps   . Nephrolithiasis     No past surgical history on file. No Known Allergies           Objective:   Physical ExamThere were no vitals filed for this visit. Alert and oriented. Skin warm and dry. Oral mucosa is moist.   . Sclera anicteric, conjunctivae is pink. Thyroid not enlarged. No cervical lymphadenopathy. Lungs  clear. Heart regular rate and rhythm.  Abdomen is soft. Bowel sounds are positive. No hepatomegaly. No abdominal masses felt. No tenderness.  No edema to lower extremities.       Assessment:   Elevated bilirubin. Bilirubin is normal at this time.     Plan:    If any problems, please call our office. May return on a prn basis.

## 2012-08-04 NOTE — Patient Instructions (Addendum)
OV prn. Any problems call our office

## 2015-06-27 ENCOUNTER — Other Ambulatory Visit (HOSPITAL_COMMUNITY): Payer: Self-pay | Admitting: Internal Medicine

## 2015-06-27 DIAGNOSIS — R945 Abnormal results of liver function studies: Secondary | ICD-10-CM

## 2015-07-01 ENCOUNTER — Ambulatory Visit (HOSPITAL_COMMUNITY)
Admission: RE | Admit: 2015-07-01 | Discharge: 2015-07-01 | Disposition: A | Discharge status: Home / Self Care | Payer: Medicare HMO | Source: Ambulatory Visit | Attending: Internal Medicine | Admitting: Internal Medicine

## 2015-07-01 DIAGNOSIS — R7989 Other specified abnormal findings of blood chemistry: Secondary | ICD-10-CM | POA: Diagnosis not present

## 2015-07-01 DIAGNOSIS — R945 Abnormal results of liver function studies: Secondary | ICD-10-CM | POA: Diagnosis not present

## 2015-11-30 DIAGNOSIS — R69 Illness, unspecified: Secondary | ICD-10-CM | POA: Diagnosis not present

## 2015-12-22 DIAGNOSIS — H57059 Tonic pupil, unspecified eye: Secondary | ICD-10-CM | POA: Diagnosis not present

## 2015-12-22 DIAGNOSIS — Z0100 Encounter for examination of eyes and vision without abnormal findings: Secondary | ICD-10-CM | POA: Diagnosis not present

## 2015-12-22 DIAGNOSIS — E7800 Pure hypercholesterolemia, unspecified: Secondary | ICD-10-CM | POA: Diagnosis not present

## 2015-12-22 DIAGNOSIS — H5200 Hypermetropia, unspecified eye: Secondary | ICD-10-CM | POA: Diagnosis not present

## 2015-12-22 DIAGNOSIS — H2510 Age-related nuclear cataract, unspecified eye: Secondary | ICD-10-CM | POA: Diagnosis not present

## 2016-06-27 DIAGNOSIS — N400 Enlarged prostate without lower urinary tract symptoms: Secondary | ICD-10-CM | POA: Diagnosis not present

## 2016-06-27 DIAGNOSIS — Z1389 Encounter for screening for other disorder: Secondary | ICD-10-CM | POA: Diagnosis not present

## 2016-06-27 DIAGNOSIS — E782 Mixed hyperlipidemia: Secondary | ICD-10-CM | POA: Diagnosis not present

## 2016-06-27 DIAGNOSIS — I10 Essential (primary) hypertension: Secondary | ICD-10-CM | POA: Diagnosis not present

## 2016-06-27 DIAGNOSIS — Z6827 Body mass index (BMI) 27.0-27.9, adult: Secondary | ICD-10-CM | POA: Diagnosis not present

## 2016-06-27 DIAGNOSIS — M1991 Primary osteoarthritis, unspecified site: Secondary | ICD-10-CM | POA: Diagnosis not present

## 2016-06-27 DIAGNOSIS — Z0000 Encounter for general adult medical examination without abnormal findings: Secondary | ICD-10-CM | POA: Diagnosis not present

## 2016-07-05 DIAGNOSIS — Z23 Encounter for immunization: Secondary | ICD-10-CM | POA: Diagnosis not present

## 2016-09-25 ENCOUNTER — Encounter: Payer: Self-pay | Admitting: Orthopedic Surgery

## 2016-09-25 ENCOUNTER — Ambulatory Visit (INDEPENDENT_AMBULATORY_CARE_PROVIDER_SITE_OTHER): Payer: Medicare HMO | Admitting: Orthopedic Surgery

## 2016-09-25 ENCOUNTER — Ambulatory Visit (INDEPENDENT_AMBULATORY_CARE_PROVIDER_SITE_OTHER): Payer: Medicare HMO

## 2016-09-25 VITALS — BP 153/70 | HR 69 | Wt 172.0 lb

## 2016-09-25 DIAGNOSIS — M7552 Bursitis of left shoulder: Secondary | ICD-10-CM

## 2016-09-25 DIAGNOSIS — M25512 Pain in left shoulder: Secondary | ICD-10-CM | POA: Diagnosis not present

## 2016-09-25 NOTE — Patient Instructions (Addendum)
You have received an injection of steroids into the joint. 15% of patients will have increased pain within the 24 hours postinjection.   This is transient and will go away.   We recommend that you use ice packs on the injection site for 20 minutes every 2 hours and extra strength Tylenol 2 tablets every 8 as needed until the pain resolves.  If you continue to have pain after taking the Tylenol and using the ice please call the office for further instructions.   Bursitis Introduction Bursitis is inflammation and irritation of a bursa, which is one of the small, fluid-filled sacs that cushion and protect the moving parts of your body. These sacs are located between bones and muscles, muscle attachments, or skin areas next to bones. A bursa protects these structures from the wear and tear that results from frequent movement. An inflamed bursa causes pain and swelling. Fluid may build up inside the sac. Bursitis is most common near joints, especially the knees, elbows, hips, and shoulders. What are the causes? Bursitis can be caused by:  Injury from:  A direct blow, like falling on your knee or elbow.  Overuse of a joint (repetitive stress).  Infection. This can happen if bacteria gets into a bursa through a cut or scrape near a joint.  Diseases that cause joint inflammation, such as gout and rheumatoid arthritis. What increases the risk? You may be at risk for bursitis if you:  Have a job or hobby that involves a lot of repetitive stress on your joints.  Have a condition that weakens your body's defense system (immune system), such as diabetes, cancer, or HIV.  Lift and reach overhead often.  Kneel or lean on hard surfaces often.  Run or walk often. What are the signs or symptoms? The most common signs and symptoms of bursitis are:  Pain that gets worse when you move the affected body part or put weight on it.  Inflammation.  Stiffness. Other signs and symptoms may  include:  Redness.  Tenderness.  Warmth.  Pain that continues after rest.  Fever and chills. This may occur in bursitis caused by infection. How is this diagnosed? Bursitis may be diagnosed by:  Medical history and physical exam.  MRI.  A procedure to drain fluid from the bursa with a needle (aspiration). The fluid may be checked for signs of infection or gout.  Blood tests to rule out other causes of inflammation. How is this treated? Bursitis can usually be treated at home with rest, ice, compression, and elevation (RICE). For mild bursitis, RICE treatment may be all you need. Other treatments may include:  Nonsteroidal anti-inflammatory drugs (NSAIDs) to treat pain and inflammation.  Corticosteroids to fight inflammation. You may have these drugs injected into and around the area of bursitis.  Aspiration of bursitis fluid to relieve pain and improve movement.  Antibiotic medicine to treat an infected bursa.  A splint, brace, or walking aid.  Physical therapy if you continue to have pain or limited movement.  Surgery to remove a damaged or infected bursa. This may be needed if you have a very bad case of bursitis or if other treatments have not worked. Follow these instructions at home:  Take medicines only as directed by your health care provider.  If you were prescribed an antibiotic medicine, finish it all even if you start to feel better.  Rest the affected area as directed by your health care provider.  Keep the area elevated.  Avoid activities  that make pain worse.  Apply ice to the injured area:  Place ice in a plastic bag.  Place a towel between your skin and the bag.  Leave the ice on for 20 minutes, 2-3 times a day.  Use splints, braces, pads, or walking aids as directed by your health care provider.  Keep all follow-up visits as directed by your health care provider. This is important. How is this prevented?  Wear knee pads if you kneel  often.  Wear sturdy running or walking shoes that fit you well.  Take regular breaks from repetitive activity.  Warm up by stretching before doing any strenuous activity.  Maintain a healthy weight or lose weight as recommended by your health care provider. Ask your health care provider if you need help.  Exercise regularly. Start any new physical activity gradually. Contact a health care provider if:  Your bursitis is not responding to treatment or home care.  You have a fever.  You have chills. This information is not intended to replace advice given to you by your health care provider. Make sure you discuss any questions you have with your health care provider. Document Released: 08/31/2000 Document Revised: 02/09/2016 Document Reviewed: 11/23/2013  2017 [employer]

## 2016-09-25 NOTE — Progress Notes (Signed)
Patient ID: Jaime [name], male   DOB: 11/10/1932, 83 y.o.   MRN: [id number]  Chief Complaint  Patient presents with  . Shoulder Pain    LEFT SHOULDER PAIN    HPI Jaime [name] is a 83 y.o. male.  Presents for evaluation of atraumatic onset of left shoulder pain with a dull ache and difficulty raising his arm. Initially he couldn't raise his arm at all now he can but it hurts. There was no trauma. Pain is present after forward elevation  Review of Systems Review of Systems  Constitutional: Negative for fever.  Respiratory: Negative for shortness of breath.   Cardiovascular: Negative for chest pain.    Past Medical History:  Diagnosis Date  . Coronary atherosclerosis of native coronary artery    Minimal coronary atherosclerosis at catheterization 2003  . History of colonic polyps   . Mixed hyperlipidemia   . Nephrolithiasis   . Osteoarthritis     No past surgical history on file.  Family History  Problem Relation Age of Onset  . Cancer Father     Bone cancer    Social History Social History  Substance Use Topics  . Smoking status: Never Smoker  . Smokeless tobacco: Current User    Types: Chew  . Alcohol use No    No Known Allergies  Current Outpatient Prescriptions  Medication Sig Dispense Refill  . AMLODIPINE BESYLATE PO Take by mouth.    . Tamsulosin HCl (FLOMAX) 0.4 MG CAPS     . aspirin 81 MG tablet Take 81 mg by mouth daily.      . calcium-vitamin D (OSCAL WITH D) 500-200 MG-UNIT per tablet Take 1 tablet by mouth daily.      . Cholecalciferol (VITAMIN D-3 PO) Take 1,200 Units by mouth daily.      . fish oil-omega-3 fatty acids 1000 MG capsule Take 2 g by mouth daily.      . multivitamin (ONE-A-DAY MEN'S) TABS Take 1 tablet by mouth daily.      . simvastatin (ZOCOR) 40 MG tablet      No current facility-administered medications for this visit.        Physical Exam BP (!) 153/70   Pulse 69   Wt 172 lb (78 kg)   BMI 26.15 kg/m  Physical Exam   Constitutional: He is oriented to person, place, and time. He appears well-developed and well-nourished. No distress.  Cardiovascular: Normal rate and intact distal pulses.   Neurological: He is alert and oriented to person, place, and time.  Skin: Skin is warm and dry. No rash noted. He is not diaphoretic. No erythema. No pallor.  Psychiatric: He has a normal mood and affect. His behavior is normal. Judgment and thought content normal.   Ambulatory status normal with no assistive devices Right Shoulder Exam  Right shoulder exam is normal.  Tenderness  The patient is experiencing no tenderness.    Range of Motion  The patient has normal right shoulder ROM.  Muscle Strength  The patient has normal right shoulder strength.  Tests  Apprehension: negative  Other  Erythema: absent Sensation: normal Pulse: present   Left Shoulder Exam   Tenderness  The patient is experiencing tenderness in the biceps tendon (Rotator interval).  Range of Motion  Active Abduction: normal  Passive Abduction: normal  Extension: normal  Forward Flexion:  150 abnormal  External Rotation: normal  Internal Rotation 0 degrees: normal  Internal Rotation 90 degrees: normal   Muscle Strength  The  patient has normal left shoulder strength. Supraspinatus: 4/5   Tests  Apprehension: negative Cross Arm: negative Hawkin's test: positive Impingement: positive  Other  Erythema: absent Sensation: normal Pulse: present        Data Reviewed Imaging of the left shoulder are independently reviewed and I interpreted these as normal shoulder with mild acromial clavicular arthritis  Assessment  Bursitis left shoulder  Encounter Diagnoses  Name Primary?  . Left shoulder pain, unspecified chronicity   . Bursitis of left shoulder Yes      Plan  Subacromial injection left shoulder  Procedure note the subacromial injection shoulder left   Verbal consent was obtained to inject the   Left   Shoulder  Timeout was completed to confirm the injection site is a subacromial space of the  left  shoulder  Medication used Depo-Medrol 40 mg and lidocaine 1% 3 cc  Anesthesia was provided by ethyl chloride  The injection was performed in the left  posterior subacromial space. After pinning the skin with alcohol and anesthetized the skin with ethyl chloride the subacromial space was injected using a 20-gauge needle. There were no complications  Sterile dressing was applied.

## 2016-12-18 DIAGNOSIS — R69 Illness, unspecified: Secondary | ICD-10-CM | POA: Diagnosis not present

## 2017-05-15 DIAGNOSIS — I10 Essential (primary) hypertension: Secondary | ICD-10-CM | POA: Diagnosis not present

## 2017-05-15 DIAGNOSIS — N400 Enlarged prostate without lower urinary tract symptoms: Secondary | ICD-10-CM | POA: Diagnosis not present

## 2017-05-15 DIAGNOSIS — M5431 Sciatica, right side: Secondary | ICD-10-CM | POA: Diagnosis not present

## 2017-05-15 DIAGNOSIS — Z6826 Body mass index (BMI) 26.0-26.9, adult: Secondary | ICD-10-CM | POA: Diagnosis not present

## 2017-07-17 DIAGNOSIS — H9193 Unspecified hearing loss, bilateral: Secondary | ICD-10-CM | POA: Diagnosis not present

## 2017-07-17 DIAGNOSIS — Z0001 Encounter for general adult medical examination with abnormal findings: Secondary | ICD-10-CM | POA: Diagnosis not present

## 2017-07-17 DIAGNOSIS — Z23 Encounter for immunization: Secondary | ICD-10-CM | POA: Diagnosis not present

## 2017-07-17 DIAGNOSIS — N401 Enlarged prostate with lower urinary tract symptoms: Secondary | ICD-10-CM | POA: Diagnosis not present

## 2017-07-17 DIAGNOSIS — Z6826 Body mass index (BMI) 26.0-26.9, adult: Secondary | ICD-10-CM | POA: Diagnosis not present

## 2017-07-17 DIAGNOSIS — M1991 Primary osteoarthritis, unspecified site: Secondary | ICD-10-CM | POA: Diagnosis not present

## 2017-07-17 DIAGNOSIS — Z1389 Encounter for screening for other disorder: Secondary | ICD-10-CM | POA: Diagnosis not present

## 2017-07-18 DIAGNOSIS — E748 Other specified disorders of carbohydrate metabolism: Secondary | ICD-10-CM | POA: Diagnosis not present

## 2017-09-21 ENCOUNTER — Emergency Department (HOSPITAL_COMMUNITY)
Admission: EM | Admit: 2017-09-21 | Discharge: 2017-09-22 | Disposition: A | Discharge status: Home / Self Care | Payer: Medicare HMO | Attending: Emergency Medicine | Admitting: Emergency Medicine

## 2017-09-21 ENCOUNTER — Emergency Department (HOSPITAL_COMMUNITY): Payer: Medicare HMO

## 2017-09-21 ENCOUNTER — Encounter (HOSPITAL_COMMUNITY): Payer: Self-pay | Admitting: *Deleted

## 2017-09-21 DIAGNOSIS — N201 Calculus of ureter: Secondary | ICD-10-CM | POA: Insufficient documentation

## 2017-09-21 DIAGNOSIS — R112 Nausea with vomiting, unspecified: Secondary | ICD-10-CM | POA: Diagnosis not present

## 2017-09-21 DIAGNOSIS — R1032 Left lower quadrant pain: Secondary | ICD-10-CM | POA: Diagnosis not present

## 2017-09-21 DIAGNOSIS — R1111 Vomiting without nausea: Secondary | ICD-10-CM | POA: Diagnosis not present

## 2017-09-21 DIAGNOSIS — N132 Hydronephrosis with renal and ureteral calculous obstruction: Secondary | ICD-10-CM | POA: Diagnosis not present

## 2017-09-21 DIAGNOSIS — Z79899 Other long term (current) drug therapy: Secondary | ICD-10-CM | POA: Insufficient documentation

## 2017-09-21 LAB — CBC WITH DIFFERENTIAL/PLATELET
BASOS ABS: 0.1 10*3/uL (ref 0.0–0.1)
BASOS PCT: 0 %
EOS PCT: 1 %
Eosinophils Absolute: 0.2 10*3/uL (ref 0.0–0.7)
HCT: 45 % (ref 39.0–52.0)
Hemoglobin: 14.4 g/dL (ref 13.0–17.0)
Lymphocytes Relative: 6 %
Lymphs Abs: 1.1 10*3/uL (ref 0.7–4.0)
MCH: 30.6 pg (ref 26.0–34.0)
MCHC: 32 g/dL (ref 30.0–36.0)
MCV: 95.7 fL (ref 78.0–100.0)
MONO ABS: 0.8 10*3/uL (ref 0.1–1.0)
Monocytes Relative: 5 %
Neutro Abs: 16.3 10*3/uL — ABNORMAL HIGH (ref 1.7–7.7)
Neutrophils Relative %: 88 %
PLATELETS: 329 10*3/uL (ref 150–400)
RBC: 4.7 MIL/uL (ref 4.22–5.81)
RDW: 13 % (ref 11.5–15.5)
WBC: 18.4 10*3/uL — ABNORMAL HIGH (ref 4.0–10.5)

## 2017-09-21 LAB — COMPREHENSIVE METABOLIC PANEL
ALBUMIN: 4.5 g/dL (ref 3.5–5.0)
ALT: 33 U/L (ref 17–63)
ANION GAP: 13 (ref 5–15)
AST: 36 U/L (ref 15–41)
Alkaline Phosphatase: 78 U/L (ref 38–126)
BUN: 21 mg/dL — AB (ref 6–20)
CHLORIDE: 106 mmol/L (ref 101–111)
CO2: 23 mmol/L (ref 22–32)
Calcium: 9.5 mg/dL (ref 8.9–10.3)
Creatinine, Ser: 1.33 mg/dL — ABNORMAL HIGH (ref 0.61–1.24)
GFR calc Af Amer: 56 mL/min — ABNORMAL LOW (ref >60)
GFR, EST NON AFRICAN AMERICAN: 48 mL/min — AB (ref >60)
GLUCOSE: 128 mg/dL — AB (ref 65–99)
POTASSIUM: 4 mmol/L (ref 3.5–5.1)
Sodium: 142 mmol/L (ref 135–145)
Total Bilirubin: 1.4 mg/dL — ABNORMAL HIGH (ref 0.3–1.2)
Total Protein: 8.3 g/dL — ABNORMAL HIGH (ref 6.5–8.1)

## 2017-09-21 LAB — URINALYSIS, ROUTINE W REFLEX MICROSCOPIC
BILIRUBIN URINE: NEGATIVE
GLUCOSE, UA: NEGATIVE mg/dL
KETONES UR: NEGATIVE mg/dL
LEUKOCYTES UA: NEGATIVE
Nitrite: NEGATIVE
PH: 5 (ref 5.0–8.0)
Protein, ur: 30 mg/dL — AB
SPECIFIC GRAVITY, URINE: 1.02 (ref 1.005–1.030)
Squamous Epithelial / LPF: NONE SEEN

## 2017-09-21 MED ORDER — SODIUM CHLORIDE 0.9 % IV SOLN
INTRAVENOUS | Status: DC
  Administered 2017-09-21: 21:00:00 via INTRAVENOUS

## 2017-09-21 MED ORDER — SODIUM CHLORIDE 0.9 % IV BOLUS (SEPSIS)
250.0000 mL | Freq: Once | INTRAVENOUS | Status: AC
  Administered 2017-09-21: 250 mL via INTRAVENOUS

## 2017-09-21 NOTE — ED Notes (Signed)
Pt returned from CT °

## 2017-09-21 NOTE — ED Triage Notes (Signed)
Pt reports left lower abdominal pain that radiates around to the left flank today, pain on and off. Associated with nausea and vomiting. No diarrhea or fevers.

## 2017-09-21 NOTE — ED Notes (Signed)
Pt transported to CT ?

## 2017-09-22 MED ORDER — ONDANSETRON 4 MG PO TBDP: 4.0000 mg | ORAL_TABLET | Freq: Three times a day (TID) | ORAL | 1 refills | Status: DC | PRN

## 2017-09-22 MED ORDER — HYDROCODONE-ACETAMINOPHEN 5-325 MG PO TABS: 1.0000 | ORAL_TABLET | Freq: Four times a day (QID) | ORAL | 0 refills | Status: DC | PRN

## 2017-09-22 NOTE — ED Provider Notes (Signed)
[facility] EMERGENCY DEPARTMENT Provider Note   CSN: [id number] Arrival date & time: 09/21/17  1911     History   Chief Complaint Chief Complaint  Patient presents with  . Abdominal Pain    HPI Jaime [name] is a 84 y.o. male.  Patient with complaint of left lower quadrant abdominal pain and left flank pain.  That started today.  Pains been on and off.  Started in the morning.  Associated with nausea and vomiting no fevers no dysuria no hematuria.  Patient has had a history of kidney stones in the past.  But none recently.  Reminds him somewhat of that.      Past Medical History:  Diagnosis Date  . Coronary atherosclerosis of native coronary artery    Minimal coronary atherosclerosis at catheterization 2003  . History of colonic polyps   . Mixed hyperlipidemia   . Nephrolithiasis   . Osteoarthritis     Patient Active Problem List   Diagnosis Date Noted  . Difficulty in walking(719.7) 03/26/2012  . Elevated bilirubin 03/25/2012  . Shortness of breath 05/16/2011  . Undiagnosed cardiac murmurs 05/16/2011  . Dizziness and giddiness 05/16/2011  . Mixed hyperlipidemia 05/16/2011  . Coronary atherosclerosis of native coronary artery 05/16/2011    History reviewed. No pertinent surgical history.     Home Medications    Prior to Admission medications   Medication Sig Start Date End Date Taking? Authorizing Provider  amLODipine (NORVASC) 5 MG tablet Take 5 mg by mouth every morning.  08/15/17  Yes [provider]  aspirin 81 MG tablet Take 81 mg by mouth every evening.    Yes [provider]  atorvastatin (LIPITOR) 40 MG tablet Take 40 mg by mouth daily. 07/19/17  Yes [provider]  calcium-vitamin D (OSCAL WITH D) 500-200 MG-UNIT per tablet Take 1 tablet by mouth daily.     Yes [provider]  Cholecalciferol (VITAMIN D-3 PO) Take 1,200 Units by mouth daily.     Yes [provider]  multivitamin (ONE-A-DAY MEN'S) TABS  Take 1 tablet by mouth daily.     Yes [provider]  Tamsulosin HCl (FLOMAX) 0.4 MG CAPS Take 0.4 mg by mouth every evening.  04/24/11  Yes [provider]  HYDROcodone-acetaminophen (NORCO/VICODIN) 5-325 MG tablet Take 1-2 tablets by mouth every 6 (six) hours as needed. 09/22/17   Fredia Sorrow, MD  ondansetron (ZOFRAN ODT) 4 MG disintegrating tablet Take 1 tablet (4 mg total) by mouth every 8 (eight) hours as needed. 09/22/17   Fredia Sorrow, MD    Family History Family History  Problem Relation Age of Onset  . Cancer Father        Bone cancer    Social History Social History   Tobacco Use  . Smoking status: Never Smoker  . Smokeless tobacco: Current User    Types: Chew  Substance Use Topics  . Alcohol use: No  . Drug use: No     Allergies   Patient has no known allergies.   Review of Systems Review of Systems  Constitutional: Negative for fever.  HENT: Negative for congestion.   Eyes: Negative for redness.  Respiratory: Negative for shortness of breath.   Cardiovascular: Negative for chest pain.  Gastrointestinal: Positive for abdominal pain, nausea and vomiting.  Genitourinary: Positive for flank pain. Negative for dysuria and hematuria.  Musculoskeletal: Negative for back pain.  Neurological: Negative for headaches.  Hematological: Does not bruise/bleed easily.  Psychiatric/Behavioral: Negative for confusion.  Physical Exam Updated Vital Signs BP (!) 152/77   Pulse 92   Temp 97.8 F (36.6 C) (Oral)   Resp 14   Ht 1.727 m (5\' 8" )   Wt 72.6 kg (160 lb)   SpO2 94%   BMI 24.33 kg/m   Physical Exam  Constitutional: He is oriented to person, place, and time. He appears well-developed. No distress.  HENT:  Head: Normocephalic and atraumatic.  Mouth/Throat: Oropharynx is clear and moist.  Eyes: Conjunctivae and EOM are normal. Pupils are equal, round, and reactive to light.  Neck: Neck supple.  Cardiovascular: Normal rate and  regular rhythm.  Pulmonary/Chest: Effort normal and breath sounds normal. No respiratory distress.  Abdominal: Soft. Bowel sounds are normal. He exhibits no mass. There is no tenderness. There is no guarding.  Musculoskeletal: Normal range of motion.  Neurological: He is alert and oriented to person, place, and time. No cranial nerve deficit or sensory deficit. He exhibits normal muscle tone. Coordination normal.  Skin: Skin is warm.  Nursing note and vitals reviewed.    ED Treatments / Results  Labs (all labs ordered are listed, but only abnormal results are displayed) Labs Reviewed  CBC WITH DIFFERENTIAL/PLATELET - Abnormal; Notable for the following components:      Result Value   WBC 18.4 (*)    Neutro Abs 16.3 (*)    All other components within normal limits  COMPREHENSIVE METABOLIC PANEL - Abnormal; Notable for the following components:   Glucose, Bld 128 (*)    BUN 21 (*)    Creatinine, Ser 1.33 (*)    Total Protein 8.3 (*)    Total Bilirubin 1.4 (*)    GFR calc non Af Amer 48 (*)    GFR calc Af Amer 56 (*)    All other components within normal limits  URINALYSIS, ROUTINE W REFLEX MICROSCOPIC - Abnormal; Notable for the following components:   APPearance HAZY (*)    Hgb urine dipstick LARGE (*)    Protein, ur 30 (*)    Bacteria, UA RARE (*)    All other components within normal limits    EKG  EKG Interpretation None       Radiology Ct Renal Stone Study  Result Date: 09/21/2017 CLINICAL DATA:  Pt reports sharp constant left lower abdominal pain that radiates around to the left flank today, pain on and off. Associated with nausea and vomiting. No diarrhea or fevers. EXAM: CT ABDOMEN AND PELVIS WITHOUT CONTRAST TECHNIQUE: Multidetector CT imaging of the abdomen and pelvis was performed following the standard protocol without IV contrast. COMPARISON:  10/16/2010 FINDINGS: Lower chest: There is significant atherosclerotic calcification of the coronary arteries.  Calcification of the mitral annulus. There is elevation of the right hemidiaphragm. Hepatobiliary: No focal liver abnormality is seen. No radiopaque gallstones, biliary dilatation, or pericholecystic inflammatory changes. Pancreas: Unremarkable. No pancreatic ductal dilatation or surrounding inflammatory changes. Spleen: Normal in size without focal abnormality. Adrenals/Urinary Tract: Adrenal glands are normal in appearance. There is left-sided hydronephrosis secondary to a calculus in the proximal left ureter which measures 6 mm at the level of L3. There is associated perinephric stranding on the left. Intrarenal calculus in the lower pole the left kidney is 3 mm. A 2 mm calculus is identified in the upper pole the right kidney. There are numerous low-attenuation lesions within the kidneys bilaterally. By density measurements, these are compatible with benign cysts. No suspicious renal lesions are identified. The urinary bladder, visualized urethra, and distal ureters are unremarkable.  Stomach/Bowel: The stomach and small bowel loops are unremarkable. The appendix is well seen and has a normal appearance. There is extensive colonic diverticulosis, particularly involving the sigmoid colon. No associated diverticulitis. Vascular/Lymphatic: There is atherosclerotic calcification of the abdominal aorta not associated with aneurysm. No retroperitoneal or mesenteric adenopathy. Reproductive:  There is enlargement of the prostate. Other: No free pelvic fluid. Anterior abdominal wall is unremarkable. Musculoskeletal: Degenerative changes are seen in the lumbar spine. Marked disc height loss particularly noted at L3-4, L4-5, and L5-S1. Disc protrusion noted at the same levels. IMPRESSION: 1. Left-sided hydronephrosis and perinephric stranding secondary to a 6 mm calculus in the proximal left ureter at the level of L3. 2. Numerous additional small bilateral intrarenal calculi. 3. Numerous small bilateral renal cysts. 4.  Normal appendix. 5. Significant colonic diverticulosis without acute diverticulitis. 6.  Aortic atherosclerosis.  (ICD10-I70.0) 7. Coronary artery disease. 8. Prostatic enlargement. 9. Degenerative disc disease of the lumbar spine. Electronically Signed   By: Nolon Nations M.D.   On: 09/21/2017 22:01    Procedures Procedures (including critical care time)  Medications Ordered in ED Medications  0.9 %  sodium chloride infusion ( Intravenous New Bag/Given 09/21/17 2102)  sodium chloride 0.9 % bolus 250 mL (0 mLs Intravenous Stopped 09/21/17 2131)     Initial Impression / Assessment and Plan / ED Course  I have reviewed the triage vital signs and the nursing notes.  Pertinent labs & imaging results that were available during my care of the patient were reviewed by me and considered in my medical decision making (see chart for details).     CT consistent with left-sided kidney stone.  Patient and not a lot of pain right now so stones probably not moving.  Still high up in the ureter.  Follow-up with urology provided pain medicine as needed at home.  Patient did not really requiring pain medicine here.  Renal function without significant abnormalities.  Creatinine was 1.33.  Urinalysis did show hematuria.  Consistent with the finding of the stone.  Patient stable for discharge home.  Final Clinical Impressions(s) / ED Diagnoses   Final diagnoses:  Left ureteral stone    ED Discharge Orders        Ordered    HYDROcodone-acetaminophen (NORCO/VICODIN) 5-325 MG tablet  Every 6 hours PRN     09/22/17 0030    ondansetron (ZOFRAN ODT) 4 MG disintegrating tablet  Every 8 hours PRN     09/22/17 0030       Fredia Sorrow, MD 09/22/17 1744

## 2017-09-22 NOTE — Discharge Instructions (Signed)
Take the pain medicine as needed.  Take the antinausea medicine as needed.  On Monday call urology for follow-up.  Return for any new or worse symptoms.

## 2017-09-23 ENCOUNTER — Ambulatory Visit (HOSPITAL_COMMUNITY): Payer: Medicare HMO | Admitting: Anesthesiology

## 2017-09-23 ENCOUNTER — Encounter (HOSPITAL_COMMUNITY): Payer: Self-pay | Admitting: *Deleted

## 2017-09-23 ENCOUNTER — Other Ambulatory Visit: Payer: Self-pay | Admitting: Urology

## 2017-09-23 ENCOUNTER — Ambulatory Visit (HOSPITAL_COMMUNITY): Payer: Medicare HMO

## 2017-09-23 ENCOUNTER — Encounter (HOSPITAL_COMMUNITY)
Admission: AD | Disposition: A | Discharge status: Home / Self Care | Payer: Self-pay | Source: Ambulatory Visit | Attending: Urology

## 2017-09-23 ENCOUNTER — Other Ambulatory Visit: Payer: Self-pay

## 2017-09-23 ENCOUNTER — Ambulatory Visit (HOSPITAL_COMMUNITY)
Admission: AD | Admit: 2017-09-23 | Discharge: 2017-09-23 | Disposition: A | Discharge status: Home / Self Care | Payer: Medicare HMO | Source: Ambulatory Visit | Attending: Urology | Admitting: Urology

## 2017-09-23 DIAGNOSIS — N281 Cyst of kidney, acquired: Secondary | ICD-10-CM | POA: Insufficient documentation

## 2017-09-23 DIAGNOSIS — N132 Hydronephrosis with renal and ureteral calculous obstruction: Secondary | ICD-10-CM | POA: Diagnosis not present

## 2017-09-23 DIAGNOSIS — I700 Atherosclerosis of aorta: Secondary | ICD-10-CM | POA: Insufficient documentation

## 2017-09-23 DIAGNOSIS — I10 Essential (primary) hypertension: Secondary | ICD-10-CM | POA: Diagnosis not present

## 2017-09-23 DIAGNOSIS — N136 Pyonephrosis: Secondary | ICD-10-CM | POA: Insufficient documentation

## 2017-09-23 DIAGNOSIS — Z87442 Personal history of urinary calculi: Secondary | ICD-10-CM | POA: Insufficient documentation

## 2017-09-23 DIAGNOSIS — Z720 Tobacco use: Secondary | ICD-10-CM | POA: Insufficient documentation

## 2017-09-23 DIAGNOSIS — E782 Mixed hyperlipidemia: Secondary | ICD-10-CM | POA: Diagnosis not present

## 2017-09-23 DIAGNOSIS — N201 Calculus of ureter: Secondary | ICD-10-CM | POA: Diagnosis not present

## 2017-09-23 DIAGNOSIS — Z79899 Other long term (current) drug therapy: Secondary | ICD-10-CM | POA: Diagnosis not present

## 2017-09-23 DIAGNOSIS — I2510 Atherosclerotic heart disease of native coronary artery without angina pectoris: Secondary | ICD-10-CM | POA: Diagnosis not present

## 2017-09-23 DIAGNOSIS — N400 Enlarged prostate without lower urinary tract symptoms: Secondary | ICD-10-CM | POA: Insufficient documentation

## 2017-09-23 DIAGNOSIS — N23 Unspecified renal colic: Secondary | ICD-10-CM | POA: Diagnosis not present

## 2017-09-23 HISTORY — PX: CYSTOSCOPY W/ URETERAL STENT PLACEMENT: SHX1429

## 2017-09-23 SURGERY — CYSTOSCOPY, WITH RETROGRADE PYELOGRAM AND URETERAL STENT INSERTION
Anesthesia: General | Site: Ureter | Laterality: Left

## 2017-09-23 MED ORDER — HYDROCODONE-ACETAMINOPHEN 5-325 MG PO TABS: 1.0000 | ORAL_TABLET | ORAL | 0 refills | Status: DC | PRN

## 2017-09-23 MED ORDER — ONDANSETRON HCL 4 MG PO TABS: 4.0000 mg | ORAL_TABLET | Freq: Every day | ORAL | 1 refills | Status: DC | PRN

## 2017-09-23 MED ORDER — PHENAZOPYRIDINE HCL 200 MG PO TABS: 200.0000 mg | ORAL_TABLET | Freq: Three times a day (TID) | ORAL | 0 refills | Status: DC | PRN

## 2017-09-23 MED ORDER — FENTANYL CITRATE (PF) 100 MCG/2ML IJ SOLN: 25.0000 ug | INTRAMUSCULAR | Status: DC | PRN

## 2017-09-23 MED ORDER — LACTATED RINGERS IV SOLN
INTRAVENOUS | Status: DC
  Administered 2017-09-23: 16:00:00 via INTRAVENOUS

## 2017-09-23 MED ORDER — PROPOFOL 10 MG/ML IV BOLUS
INTRAVENOUS | Status: AC
  Filled 2017-09-23: qty 20

## 2017-09-23 MED ORDER — FENTANYL CITRATE (PF) 100 MCG/2ML IJ SOLN
INTRAMUSCULAR | Status: AC
  Filled 2017-09-23: qty 2

## 2017-09-23 MED ORDER — PROPOFOL 10 MG/ML IV BOLUS
INTRAVENOUS | Status: DC | PRN
  Administered 2017-09-23: 20 mg via INTRAVENOUS
  Administered 2017-09-23: 100 mg via INTRAVENOUS

## 2017-09-23 MED ORDER — FENTANYL CITRATE (PF) 100 MCG/2ML IJ SOLN
INTRAMUSCULAR | Status: DC | PRN
  Administered 2017-09-23: 25 ug via INTRAVENOUS
  Administered 2017-09-23: 50 ug via INTRAVENOUS
  Administered 2017-09-23: 25 ug via INTRAVENOUS

## 2017-09-23 MED ORDER — ONDANSETRON HCL 4 MG/2ML IJ SOLN: 4.0000 mg | Freq: Once | INTRAMUSCULAR | Status: DC | PRN

## 2017-09-23 MED ORDER — CIPROFLOXACIN IN D5W 400 MG/200ML IV SOLN
400.0000 mg | INTRAVENOUS | Status: AC
  Administered 2017-09-23: 400 mg via INTRAVENOUS
  Filled 2017-09-23: qty 200

## 2017-09-23 MED ORDER — CIPROFLOXACIN HCL 500 MG PO TABS: 500.0000 mg | ORAL_TABLET | Freq: Two times a day (BID) | ORAL | 0 refills | Status: AC

## 2017-09-23 MED ORDER — IOHEXOL 300 MG/ML  SOLN
INTRAMUSCULAR | Status: DC | PRN
  Administered 2017-09-23: 9 mL via URETHRAL

## 2017-09-23 SURGICAL SUPPLY — 16 items
BAG URINE DRAINAGE (UROLOGICAL SUPPLIES) ×2 IMPLANT
BAG URO CATCHER STRL LF (MISCELLANEOUS) ×2 IMPLANT
CATH FOLEY 2WAY SLVR  5CC 18FR (CATHETERS) ×1
CATH FOLEY 2WAY SLVR 5CC 18FR (CATHETERS) ×1 IMPLANT
CATH INTERMIT  6FR 70CM (CATHETERS) ×2 IMPLANT
CLOTH BEACON ORANGE TIMEOUT ST (SAFETY) ×2 IMPLANT
COVER FOOTSWITCH UNIV (MISCELLANEOUS) IMPLANT
COVER SURGICAL LIGHT HANDLE (MISCELLANEOUS) ×2 IMPLANT
GLOVE BIOGEL M STRL SZ7.5 (GLOVE) ×2 IMPLANT
GOWN STRL REUS W/TWL LRG LVL3 (GOWN DISPOSABLE) ×4 IMPLANT
GUIDEWIRE STR DUAL SENSOR (WIRE) ×2 IMPLANT
MANIFOLD NEPTUNE II (INSTRUMENTS) ×2 IMPLANT
PACK CYSTO (CUSTOM PROCEDURE TRAY) ×2 IMPLANT
STENT URET 6FRX26 CONTOUR (STENTS) ×2 IMPLANT
SYR 20CC LL (SYRINGE) ×2 IMPLANT
TUBING CONNECTING 10 (TUBING) ×2 IMPLANT

## 2017-09-23 NOTE — Transfer of Care (Signed)
Immediate Anesthesia Transfer of Care Note  Patient: Jaime [name]  Procedure(s) Performed: CYSTOSCOPY WITH RETROGRADE PYELOGRAM/URETERAL LEFT STENT PLACEMENT (Left Ureter)  Patient Location: PACU  Anesthesia Type:General  Level of Consciousness: awake, alert  and oriented  Airway & Oxygen Therapy: Patient Spontanous Breathing and Patient connected to face mask oxygen  Post-op Assessment: Report given to RN and Post -op Vital signs reviewed and stable  Post vital signs: Reviewed and stable  Last Vitals:  Vitals:   09/23/17 1541 09/23/17 1848  BP: (!) 145/80 134/66  Pulse: 88 92  Resp: 16   Temp: 36.9 C 37.8 C  SpO2: 94% 100%    Last Pain:  Vitals:   09/23/17 1541  TempSrc: Oral  PainSc:          Complications: No apparent anesthesia complications

## 2017-09-23 NOTE — H&P (Signed)
Urology Preoperative H&P   Chief Complaint: Left flank pain  History of Present Illness: Jaime [name] is a 84 y.o. male  seen at the [facility] ED on 09/21/2017 for acute onset of left-sided flank pain associated with nausea/vomiting.  He had a CT stone study at that time that demonstrated a 6 mm left UPJ calculus with moderate hydronephrosis and left perinephric stranding.  His creatinine was elevated at 1.33 and his white blood cell count was 18 at that time.  His vital signs were stable and he was afebrile.  He presents to the office today with intermittent episodes of left flank pain associated with persistent nausea/vomiting.  He denies fever/chills, dysuria or hematuria over the past 48 hours.  Urinalysis performed in the office today is consistent with a urinary tract infection.  He has a remote history of nephrolithiasis that required ESWL several years ago.    Past Medical History:  Diagnosis Date  . Coronary atherosclerosis of native coronary artery    Minimal coronary atherosclerosis at catheterization 2003  . History of colonic polyps   . Mixed hyperlipidemia   . Nephrolithiasis   . Osteoarthritis     History reviewed. No pertinent surgical history.  Allergies: No Known Allergies  Family History  Problem Relation Age of Onset  . Cancer Father        Bone cancer    Social History:  reports that  has never smoked. His smokeless tobacco use includes chew. He reports that he does not drink alcohol or use drugs.  ROS: A complete review of systems was performed.  All systems are negative except for pertinent findings as noted.  Physical Exam:  Vital signs in last 24 hours: Temp:  [98.5 F (36.9 C)] 98.5 F (36.9 C) (01/07 1541) Pulse Rate:  [88] 88 (01/07 1541) Resp:  [16] 16 (01/07 1541) BP: (145)/(80) 145/80 (01/07 1541) SpO2:  [94 %] 94 % (01/07 1541) Weight:  [76.2 kg (168 lb)] 76.2 kg (168 lb) (01/07 1541) Constitutional:  Alert and oriented, No acute  distress Cardiovascular: Regular rate and rhythm, No JVD Respiratory: Normal respiratory effort, Lungs clear bilaterally GI: Abdomen is soft, nontender, nondistended, no abdominal masses GU: No CVA tenderness Lymphatic: No lymphadenopathy Neurologic: Grossly intact, no focal deficits Psychiatric: Normal mood and affect  Laboratory Data:  Recent Labs    09/21/17 2000  WBC 18.4*  HGB 14.4  HCT 45.0  PLT 329    Recent Labs    09/21/17 2000  NA 142  K 4.0  CL 106  GLUCOSE 128*  BUN 21*  CALCIUM 9.5  CREATININE 1.33*     No results found for this or any previous visit (from the past 24 hour(s)). No results found for this or any previous visit (from the past 240 hour(s)).  Renal Function: Recent Labs    09/21/17 2000  CREATININE 1.33*   Estimated Creatinine Clearance: 41.4 mL/min (A) (by C-G formula based on SCr of 1.33 mg/dL (H)).  Radiologic Imaging: Ct Renal Stone Study  Result Date: 09/21/2017 CLINICAL DATA:  Pt reports sharp constant left lower abdominal pain that radiates around to the left flank today, pain on and off. Associated with nausea and vomiting. No diarrhea or fevers. EXAM: CT ABDOMEN AND PELVIS WITHOUT CONTRAST TECHNIQUE: Multidetector CT imaging of the abdomen and pelvis was performed following the standard protocol without IV contrast. COMPARISON:  10/16/2010 FINDINGS: Lower chest: There is significant atherosclerotic calcification of the coronary arteries. Calcification of the mitral annulus. There is  elevation of the right hemidiaphragm. Hepatobiliary: No focal liver abnormality is seen. No radiopaque gallstones, biliary dilatation, or pericholecystic inflammatory changes. Pancreas: Unremarkable. No pancreatic ductal dilatation or surrounding inflammatory changes. Spleen: Normal in size without focal abnormality. Adrenals/Urinary Tract: Adrenal glands are normal in appearance. There is left-sided hydronephrosis secondary to a calculus in the proximal left  ureter which measures 6 mm at the level of L3. There is associated perinephric stranding on the left. Intrarenal calculus in the lower pole the left kidney is 3 mm. A 2 mm calculus is identified in the upper pole the right kidney. There are numerous low-attenuation lesions within the kidneys bilaterally. By density measurements, these are compatible with benign cysts. No suspicious renal lesions are identified. The urinary bladder, visualized urethra, and distal ureters are unremarkable. Stomach/Bowel: The stomach and small bowel loops are unremarkable. The appendix is well seen and has a normal appearance. There is extensive colonic diverticulosis, particularly involving the sigmoid colon. No associated diverticulitis. Vascular/Lymphatic: There is atherosclerotic calcification of the abdominal aorta not associated with aneurysm. No retroperitoneal or mesenteric adenopathy. Reproductive:  There is enlargement of the prostate. Other: No free pelvic fluid. Anterior abdominal wall is unremarkable. Musculoskeletal: Degenerative changes are seen in the lumbar spine. Marked disc height loss particularly noted at L3-4, L4-5, and L5-S1. Disc protrusion noted at the same levels. IMPRESSION: 1. Left-sided hydronephrosis and perinephric stranding secondary to a 6 mm calculus in the proximal left ureter at the level of L3. 2. Numerous additional small bilateral intrarenal calculi. 3. Numerous small bilateral renal cysts. 4. Normal appendix. 5. Significant colonic diverticulosis without acute diverticulitis. 6.  Aortic atherosclerosis.  (ICD10-I70.0) 7. Coronary artery disease. 8. Prostatic enlargement. 9. Degenerative disc disease of the lumbar spine. Electronically Signed   By: Nolon Nations M.D.   On: 09/21/2017 22:01    I independently reviewed the above imaging studies.  Assessment and Plan Jaime [name] is a 84 y.o. male with 6 mm left UPJ calculus with moderate hydronephrosis and a possible urinary tract  infection  -The risks, benefits and alternatives of cystoscopy with left JJ stent placement was discussed with the patient.  Risks include bleeding, infection, ureteral injury, ureteral stricture disease, the need for possible percutaneous nephrostomy tube placement showed retrograde access not be obtained, a urinary tract requiring prolonged IV antibiotics and the inherent risks with general anesthesia.  He voices understanding and wishes to proceed.     Ellison Hughs, MD 09/23/2017, 4:20 PM  [facility] Pager: [phone]

## 2017-09-23 NOTE — Anesthesia Preprocedure Evaluation (Signed)
Anesthesia Evaluation  Patient identified by MRN, date of birth, ID band Patient awake    Reviewed: Allergy & Precautions, NPO status , Patient's Chart, lab work & pertinent test results  Airway Mallampati: III  TM Distance: >3 FB Neck ROM: Full    Dental  (+) Partial Upper   Pulmonary neg pulmonary ROS,    Pulmonary exam normal breath sounds clear to auscultation       Cardiovascular hypertension, Pt. on medications Normal cardiovascular exam Rhythm:Regular Rate:Normal     Neuro/Psych negative neurological ROS  negative psych ROS   GI/Hepatic negative GI ROS, Neg liver ROS,   Endo/Other  negative endocrine ROS  Renal/GU negative Renal ROS     Musculoskeletal negative musculoskeletal ROS (+)   Abdominal   Peds  Hematology HLD   Anesthesia Other Findings left ureteral stone  Reproductive/Obstetrics negative OB ROS                             Anesthesia Physical Anesthesia Plan  ASA: II  Anesthesia Plan: General   Post-op Pain Management:    Induction: Intravenous  PONV Risk Score and Plan: 2 and Dexamethasone, Ondansetron and Treatment may vary due to age or medical condition  Airway Management Planned: LMA  Additional Equipment:   Intra-op Plan:   Post-operative Plan: Extubation in OR  Informed Consent: I have reviewed the patients History and Physical, chart, labs and discussed the procedure including the risks, benefits and alternatives for the proposed anesthesia with the patient or authorized representative who has indicated his/her understanding and acceptance.   Dental advisory given  Plan Discussed with: CRNA  Anesthesia Plan Comments:         Anesthesia Quick Evaluation

## 2017-09-23 NOTE — Anesthesia Procedure Notes (Signed)
Procedure Name: LMA Insertion Date/Time: 09/23/2017 6:18 PM Performed by: Anne Fu, CRNA Pre-anesthesia Checklist: Patient identified, Emergency Drugs available, Suction available, Patient being monitored and Timeout performed Patient Re-evaluated:Patient Re-evaluated prior to induction Oxygen Delivery Method: Circle system utilized Preoxygenation: Pre-oxygenation with 100% oxygen Induction Type: IV induction Ventilation: Mask ventilation without difficulty LMA: LMA inserted LMA Size: 4.0 Number of attempts: 1 Placement Confirmation: positive ETCO2 and breath sounds checked- equal and bilateral Tube secured with: Tape

## 2017-09-23 NOTE — Discharge Instructions (Signed)
1.  Ok to shower 2.  No driving on pain medication 3.  Remove Foley catheter at 6 AM on 09/24/17

## 2017-09-23 NOTE — Op Note (Addendum)
Operative Note  Preoperative diagnosis:  1.  6 mm left UPJ stone with moderate hydronephrosis 2.  Urinary tract infection  Postoperative diagnosis: 1.  6 mm left UPJ stone with moderate hydronephrosis 2.  Urinary tract infection  Procedure(s): 1.  Cystoscopy with left retrograde pyelogram with intraoperative interpretation of fluoroscopic imaging 2. Left JJ stent placement  Surgeon: Ellison Hughs, MD  Assistants:  None  Anesthesia:  Gen.  Complications:  None  EBL:  Less than 5 ML  Specimens: 1. None  Drains/Catheters: 1.  Left 6 French by 26 cm JJ stent 2. 18 French Foley catheter with 10 mL in the Foley balloon  Intraoperative findings:   1.  Purulent appearing urine was expressed following JJ stent placement 2. Prominent prostatic median lobe  Indication:  Jaime [name] is a 84 y.o. male with a 6 mm obstructing left UPJ calculus with moderate hydronephrosis and concerns for urinary tract infection. He was seen in the office on 09/23/2017 with complaints of intermittent left flank pain, nausea/vomiting, chills and subjective fevers at home. He has been consented for the above procedures, voices understanding and wishes to proceed.  Description of procedure:  After informed consent was obtained, the patient was brought to the operating room and general LMA anesthesia was administered. The patient was then placed in the dorsolithotomy position and prepped and draped in usual sterile fashion. A timeout was performed. A 21 French rigid cystoscope was then inserted into the urethral meatus and advanced into the bladder under direct vision. A complete bladder survey revealed no intravesical pathology. A prominent median lobe was identified on cystoscopy with no other pathology.  The left ureteral orifice was identified and intubated with a 6 French ureteral catheter. A left retrograde pyelogram was obtained that showed a filling defect within the proximal aspects of the  left ureter, consistent with the stone seen on his CT. The left renal pelvis appeared dilated with no other filling defects seen within the renal calyces.  A Glidewire was then advanced through the lumen of the ureteral catheter up to the left renal pelvis, bypassing the obstructing stone in the proximal ureter. A 6 [place] JJ stent was then advanced over the wire and into good position within the left collecting system, confirming placement via fluoroscopy. There was purulent appearing urine draining from alongside and through the lumen of the stent following placement. The decision was then made to place a Foley catheter for maximal drainage of his urine overnight. An 18 French Foley catheter was then placed with return of slightly blood-tinged urine and placed to gravity drainage. The patient tolerated the procedure well and was transferred to the postanesthesia in stable condition.  Plan:  Urine culture and sensitivity is pending. Will plan on definitive stone treatment in 1 week once his urinary tract infection is cleared.  The patient has been instructed to remove the Foley catheter at 6 AM on 09/23/17.

## 2017-09-24 ENCOUNTER — Encounter (HOSPITAL_COMMUNITY): Payer: Self-pay | Admitting: Urology

## 2017-09-24 DIAGNOSIS — E782 Mixed hyperlipidemia: Secondary | ICD-10-CM | POA: Diagnosis not present

## 2017-09-24 DIAGNOSIS — N400 Enlarged prostate without lower urinary tract symptoms: Secondary | ICD-10-CM | POA: Diagnosis not present

## 2017-09-24 DIAGNOSIS — N136 Pyonephrosis: Secondary | ICD-10-CM | POA: Diagnosis not present

## 2017-09-24 DIAGNOSIS — Z720 Tobacco use: Secondary | ICD-10-CM | POA: Diagnosis not present

## 2017-09-24 DIAGNOSIS — N281 Cyst of kidney, acquired: Secondary | ICD-10-CM | POA: Diagnosis not present

## 2017-09-24 DIAGNOSIS — Z79899 Other long term (current) drug therapy: Secondary | ICD-10-CM | POA: Diagnosis not present

## 2017-09-24 DIAGNOSIS — I700 Atherosclerosis of aorta: Secondary | ICD-10-CM | POA: Diagnosis not present

## 2017-09-24 DIAGNOSIS — Z87442 Personal history of urinary calculi: Secondary | ICD-10-CM | POA: Diagnosis not present

## 2017-09-24 DIAGNOSIS — I2510 Atherosclerotic heart disease of native coronary artery without angina pectoris: Secondary | ICD-10-CM | POA: Diagnosis not present

## 2017-09-24 NOTE — Anesthesia Postprocedure Evaluation (Signed)
Anesthesia Post Note  Patient: Jaime [name]  Procedure(s) Performed: CYSTOSCOPY WITH RETROGRADE PYELOGRAM/URETERAL LEFT STENT PLACEMENT (Left Ureter)     Patient location during evaluation: PACU Anesthesia Type: General Level of consciousness: awake and alert Pain management: pain level controlled Vital Signs Assessment: post-procedure vital signs reviewed and stable Respiratory status: spontaneous breathing, nonlabored ventilation, respiratory function stable and patient connected to nasal cannula oxygen Cardiovascular status: blood pressure returned to baseline and stable Postop Assessment: no apparent nausea or vomiting Anesthetic complications: no    Last Vitals:  Vitals:   09/23/17 1930 09/23/17 2015  BP: 137/66 139/63  Pulse: 86 91  Resp: 19 17  Temp: 37.6 C 36.9 C  SpO2: 100% 93%    Last Pain:  Vitals:   09/24/17 1834  TempSrc:   PainSc: 0-No pain                 Ryan P Ellender

## 2017-09-25 DIAGNOSIS — N23 Unspecified renal colic: Secondary | ICD-10-CM | POA: Diagnosis not present

## 2017-09-25 DIAGNOSIS — N201 Calculus of ureter: Secondary | ICD-10-CM | POA: Diagnosis not present

## 2017-09-25 DIAGNOSIS — R338 Other retention of urine: Secondary | ICD-10-CM | POA: Diagnosis not present

## 2017-09-27 ENCOUNTER — Other Ambulatory Visit: Payer: Self-pay | Admitting: Urology

## 2017-09-30 ENCOUNTER — Other Ambulatory Visit: Payer: Self-pay

## 2017-09-30 ENCOUNTER — Encounter (HOSPITAL_BASED_OUTPATIENT_CLINIC_OR_DEPARTMENT_OTHER): Payer: Self-pay | Admitting: *Deleted

## 2017-09-30 NOTE — Progress Notes (Signed)
SPOKE W/ PT WIFE, Jaime [name], VIA PHONE [place].  PT HOH OVER THE PHONE.  NPO AFTER MN.  ARRIVE AT 0630.  CURRENT LAB RESULTS IN CHART AND Epic.  MAY TAKE PAIN RX/ NAUSEA RX AM DOS W/ SIPS OF WATER IF NEEDED.

## 2017-10-01 NOTE — H&P (Signed)
Urology Preoperative H&P   Chief Complaint: Left UPJ stone  History of Present Illness: Jaime [name] is a 84 y.o. male with an obstructing 6 mm left UPJ stone, s/p left ureteroscopy and stent placement on 09/23/17.  Following stent placement there, purlent urine was drained and the patient has been placed on a week long course of cipro.  He went into urinary retention following surgery and has had an indwelling Foley catheter in since 09/25/17.  He currently denies flank pain, hematuria, fever/chills or nausea/vomiting.      Past Medical History:  Diagnosis Date  . BPH (benign prostatic hyperplasia)   . Coronary atherosclerosis of native coronary artery    Minimal coronary atherosclerosis at catheterization 2003  . Diverticulosis of colon   . Exertional dyspnea   . History of colonic polyps   . History of exercise stress test 05-31-2011   dr Lattie Haw   adequate but abnormal graded exercise test revealing reasonably good exercise capacity, no chest discomfort or other ischemic symptoms, borderline hypertensive blood pressure response and an ecg response consistent with myocardial ishcemia , but with very rapid resolution after the cessation of exercise suggesting a possible false positive ecg response   . History of kidney stones   . Left ureteral stone   . Mixed hyperlipidemia   . Nephrolithiasis   . Osteoarthritis   . Wears glasses   . Wears hearing aid in both ears   . Wears partial dentures    upper only    Past Surgical History:  Procedure Laterality Date  . CARDIAC CATHETERIZATION  02-27-2002  dr hochrein   minimal coronary plaquing , normal lvf  . CYSTOSCOPY W/ URETERAL STENT PLACEMENT Left 09/23/2017   Procedure: CYSTOSCOPY WITH RETROGRADE PYELOGRAM/URETERAL LEFT STENT PLACEMENT;  Surgeon: Ceasar Mons, MD;  Location: [facility];  Service: Urology;  Laterality: Left;  . EXTRACORPOREAL SHOCK WAVE LITHOTRIPSY  2002  . TRANSTHORACIC ECHOCARDIOGRAM  05/31/2011   dr s.  Domenic Polite   mild concentric LVH, ef 60-65%/  moderately calcified AV w/ mild AV stenosis, no regurg. (mean grandient 39mmHg, peak gradient 35mmHg)/ trivial TR    Allergies: No Known Allergies  Family History  Problem Relation Age of Onset  . Cancer Father        Bone cancer    Social History:  reports that  has never smoked. His smokeless tobacco use includes chew. He reports that he does not drink alcohol or use drugs.  ROS: A complete review of systems was performed.  All systems are negative except for pertinent findings as noted.  Physical Exam:  Vital signs in last 24 hours:   Constitutional:  Alert and oriented, No acute distress Cardiovascular: Regular rate and rhythm, No JVD Respiratory: Normal respiratory effort, Lungs clear bilaterally GI: Abdomen is soft, nontender, nondistended, no abdominal masses GU: No CVA tenderness Lymphatic: No lymphadenopathy Neurologic: Grossly intact, no focal deficits Psychiatric: Normal mood and affect  Laboratory Data:  No results for input(s): WBC, HGB, HCT, PLT in the last 72 hours.  No results for input(s): NA, K, CL, GLUCOSE, BUN, CALCIUM, CREATININE in the last 72 hours.  Invalid input(s): CO3   No results found for this or any previous visit (from the past 24 hour(s)). No results found for this or any previous visit (from the past 240 hour(s)).  Renal Function: No results for input(s): CREATININE in the last 168 hours. Estimated Creatinine Clearance: 41.4 mL/min (A) (by C-G formula based on SCr of 1.33 mg/dL (H)).  Radiologic Imaging: No results found.  I independently reviewed the above imaging studies.  Assessment and Plan Jaime [name] is a 84 y.o. male with a 6 mm left UPJ stone, s/p left JJ stent placement on 09/23/17  -The risks, benefits and alternatives of cystoscopy with left ureteroscopy, laser lithotripsy and possible left ureteral stent replacement was discussed with the patient.  He voices understanding and  wishes to proceed  Ellison Hughs, MD 10/01/2017, 6:25 PM  [facility] Pager: [phone]

## 2017-10-02 ENCOUNTER — Ambulatory Visit (HOSPITAL_BASED_OUTPATIENT_CLINIC_OR_DEPARTMENT_OTHER): Payer: Medicare HMO | Admitting: Anesthesiology

## 2017-10-02 ENCOUNTER — Encounter (HOSPITAL_BASED_OUTPATIENT_CLINIC_OR_DEPARTMENT_OTHER)
Admission: RE | Disposition: A | Discharge status: Home / Self Care | Payer: Self-pay | Source: Ambulatory Visit | Attending: Urology

## 2017-10-02 ENCOUNTER — Ambulatory Visit (HOSPITAL_BASED_OUTPATIENT_CLINIC_OR_DEPARTMENT_OTHER)
Admission: RE | Admit: 2017-10-02 | Discharge: 2017-10-02 | Disposition: A | Discharge status: Home / Self Care | Payer: Medicare HMO | Source: Ambulatory Visit | Attending: Urology | Admitting: Urology

## 2017-10-02 ENCOUNTER — Encounter (HOSPITAL_BASED_OUTPATIENT_CLINIC_OR_DEPARTMENT_OTHER): Payer: Self-pay

## 2017-10-02 DIAGNOSIS — E782 Mixed hyperlipidemia: Secondary | ICD-10-CM | POA: Diagnosis not present

## 2017-10-02 DIAGNOSIS — Z79899 Other long term (current) drug therapy: Secondary | ICD-10-CM | POA: Diagnosis not present

## 2017-10-02 DIAGNOSIS — Z87440 Personal history of urinary (tract) infections: Secondary | ICD-10-CM | POA: Insufficient documentation

## 2017-10-02 DIAGNOSIS — N400 Enlarged prostate without lower urinary tract symptoms: Secondary | ICD-10-CM | POA: Diagnosis not present

## 2017-10-02 DIAGNOSIS — N201 Calculus of ureter: Secondary | ICD-10-CM | POA: Diagnosis not present

## 2017-10-02 DIAGNOSIS — M1990 Unspecified osteoarthritis, unspecified site: Secondary | ICD-10-CM | POA: Insufficient documentation

## 2017-10-02 DIAGNOSIS — R69 Illness, unspecified: Secondary | ICD-10-CM | POA: Diagnosis not present

## 2017-10-02 DIAGNOSIS — N390 Urinary tract infection, site not specified: Secondary | ICD-10-CM | POA: Diagnosis not present

## 2017-10-02 DIAGNOSIS — Z87442 Personal history of urinary calculi: Secondary | ICD-10-CM | POA: Diagnosis not present

## 2017-10-02 DIAGNOSIS — I2510 Atherosclerotic heart disease of native coronary artery without angina pectoris: Secondary | ICD-10-CM | POA: Diagnosis not present

## 2017-10-02 DIAGNOSIS — F17220 Nicotine dependence, chewing tobacco, uncomplicated: Secondary | ICD-10-CM | POA: Insufficient documentation

## 2017-10-02 DIAGNOSIS — I10 Essential (primary) hypertension: Secondary | ICD-10-CM | POA: Diagnosis not present

## 2017-10-02 HISTORY — DX: Diverticulosis of large intestine without perforation or abscess without bleeding: K57.30

## 2017-10-02 HISTORY — DX: Presence of spectacles and contact lenses: Z97.3

## 2017-10-02 HISTORY — DX: Presence of external hearing-aid: Z97.4

## 2017-10-02 HISTORY — PX: CYSTOSCOPY/URETEROSCOPY/HOLMIUM LASER/STENT PLACEMENT: SHX6546

## 2017-10-02 HISTORY — DX: Other forms of dyspnea: R06.09

## 2017-10-02 HISTORY — DX: Personal history of other medical treatment: Z92.89

## 2017-10-02 HISTORY — DX: Benign prostatic hyperplasia without lower urinary tract symptoms: N40.0

## 2017-10-02 HISTORY — DX: Dyspnea, unspecified: R06.00

## 2017-10-02 HISTORY — DX: Calculus of ureter: N20.1

## 2017-10-02 HISTORY — DX: Presence of dental prosthetic device (complete) (partial): Z97.2

## 2017-10-02 HISTORY — DX: Personal history of urinary calculi: Z87.442

## 2017-10-02 SURGERY — CYSTOSCOPY/URETEROSCOPY/HOLMIUM LASER/STENT PLACEMENT
Anesthesia: General | Laterality: Left

## 2017-10-02 MED ORDER — OXYCODONE HCL 5 MG/5ML PO SOLN
5.0000 mg | Freq: Once | ORAL | Status: DC | PRN
  Filled 2017-10-02: qty 5

## 2017-10-02 MED ORDER — KETOROLAC TROMETHAMINE 15 MG/ML IJ SOLN
15.0000 mg | Freq: Once | INTRAMUSCULAR | Status: DC
  Filled 2017-10-02: qty 1

## 2017-10-02 MED ORDER — DEXAMETHASONE SODIUM PHOSPHATE 10 MG/ML IJ SOLN
INTRAMUSCULAR | Status: AC
  Filled 2017-10-02: qty 1

## 2017-10-02 MED ORDER — LIDOCAINE HCL (CARDIAC) 20 MG/ML IV SOLN
INTRAVENOUS | Status: DC | PRN
  Administered 2017-10-02 (×2): 40 mg via INTRAVENOUS

## 2017-10-02 MED ORDER — ACETAMINOPHEN 325 MG PO TABS
325.0000 mg | ORAL_TABLET | ORAL | Status: DC | PRN
  Filled 2017-10-02: qty 2

## 2017-10-02 MED ORDER — ONDANSETRON HCL 4 MG/2ML IJ SOLN
4.0000 mg | Freq: Once | INTRAMUSCULAR | Status: DC | PRN
  Filled 2017-10-02: qty 2

## 2017-10-02 MED ORDER — SODIUM CHLORIDE 0.9 % IR SOLN
Status: DC | PRN
  Administered 2017-10-02: 3000 mL via INTRAVESICAL

## 2017-10-02 MED ORDER — LIDOCAINE 2% (20 MG/ML) 5 ML SYRINGE
INTRAMUSCULAR | Status: AC
  Filled 2017-10-02: qty 5

## 2017-10-02 MED ORDER — PROPOFOL 10 MG/ML IV BOLUS
INTRAVENOUS | Status: DC | PRN
  Administered 2017-10-02: 20 mg via INTRAVENOUS
  Administered 2017-10-02: 80 mg via INTRAVENOUS
  Administered 2017-10-02: 100 mg via INTRAVENOUS

## 2017-10-02 MED ORDER — LACTATED RINGERS IV SOLN
INTRAVENOUS | Status: DC
  Administered 2017-10-02: 07:00:00 via INTRAVENOUS
  Filled 2017-10-02: qty 1000

## 2017-10-02 MED ORDER — MEPERIDINE HCL 25 MG/ML IJ SOLN
6.2500 mg | INTRAMUSCULAR | Status: DC | PRN
  Filled 2017-10-02: qty 1

## 2017-10-02 MED ORDER — PROPOFOL 10 MG/ML IV BOLUS
INTRAVENOUS | Status: AC
  Filled 2017-10-02: qty 20

## 2017-10-02 MED ORDER — FENTANYL CITRATE (PF) 100 MCG/2ML IJ SOLN
25.0000 ug | INTRAMUSCULAR | Status: DC | PRN
  Filled 2017-10-02: qty 1

## 2017-10-02 MED ORDER — ONDANSETRON HCL 4 MG/2ML IJ SOLN
INTRAMUSCULAR | Status: DC | PRN
  Administered 2017-10-02: 4 mg via INTRAVENOUS

## 2017-10-02 MED ORDER — CIPROFLOXACIN IN D5W 400 MG/200ML IV SOLN
400.0000 mg | Freq: Once | INTRAVENOUS | Status: AC
  Administered 2017-10-02: 400 mg via INTRAVENOUS
  Filled 2017-10-02: qty 200

## 2017-10-02 MED ORDER — FENTANYL CITRATE (PF) 100 MCG/2ML IJ SOLN
INTRAMUSCULAR | Status: AC
  Filled 2017-10-02: qty 2

## 2017-10-02 MED ORDER — CIPROFLOXACIN IN D5W 400 MG/200ML IV SOLN
INTRAVENOUS | Status: AC
  Filled 2017-10-02: qty 200

## 2017-10-02 MED ORDER — ONDANSETRON HCL 4 MG/2ML IJ SOLN
INTRAMUSCULAR | Status: AC
  Filled 2017-10-02: qty 2

## 2017-10-02 MED ORDER — FENTANYL CITRATE (PF) 100 MCG/2ML IJ SOLN
INTRAMUSCULAR | Status: DC | PRN
  Administered 2017-10-02: 25 ug via INTRAVENOUS
  Administered 2017-10-02: 50 ug via INTRAVENOUS
  Administered 2017-10-02: 25 ug via INTRAVENOUS

## 2017-10-02 MED ORDER — ACETAMINOPHEN 160 MG/5ML PO SOLN
325.0000 mg | ORAL | Status: DC | PRN
  Filled 2017-10-02: qty 20.3

## 2017-10-02 MED ORDER — IOHEXOL 300 MG/ML  SOLN
INTRAMUSCULAR | Status: DC | PRN
  Administered 2017-10-02: 1 mL

## 2017-10-02 MED ORDER — DEXAMETHASONE SODIUM PHOSPHATE 4 MG/ML IJ SOLN
INTRAMUSCULAR | Status: DC | PRN
  Administered 2017-10-02: 10 mg via INTRAVENOUS

## 2017-10-02 MED ORDER — OXYCODONE HCL 5 MG PO TABS
5.0000 mg | ORAL_TABLET | Freq: Once | ORAL | Status: DC | PRN
  Filled 2017-10-02: qty 1

## 2017-10-02 SURGICAL SUPPLY — 26 items
BAG DRAIN URO-CYSTO SKYTR STRL (DRAIN) ×2 IMPLANT
BASKET STONE 1.7 NGAGE (UROLOGICAL SUPPLIES) IMPLANT
BASKET ZERO TIP NITINOL 2.4FR (BASKET) ×2 IMPLANT
BENZOIN TINCTURE PRP APPL 2/3 (GAUZE/BANDAGES/DRESSINGS) IMPLANT
CATH FOLEY 2WAY SLVR  5CC 18FR (CATHETERS) ×1
CATH FOLEY 2WAY SLVR 5CC 18FR (CATHETERS) ×1 IMPLANT
CATH INTERMIT  6FR 70CM (CATHETERS) ×2 IMPLANT
CLOTH BEACON ORANGE TIMEOUT ST (SAFETY) ×2 IMPLANT
FIBER LASER FLEXIVA 365 (UROLOGICAL SUPPLIES) ×2 IMPLANT
FIBER LASER TRAC TIP (UROLOGICAL SUPPLIES) IMPLANT
GLOVE BIO SURGEON STRL SZ7.5 (GLOVE) ×2 IMPLANT
GOWN STRL REUS W/TWL XL LVL3 (GOWN DISPOSABLE) ×2 IMPLANT
GUIDEWIRE ANG ZIPWIRE 038X150 (WIRE) ×2 IMPLANT
GUIDEWIRE STR DUAL SENSOR (WIRE) IMPLANT
INFUSOR MANOMETER BAG 3000ML (MISCELLANEOUS) ×2 IMPLANT
IV NS 1000ML (IV SOLUTION) ×1
IV NS 1000ML BAXH (IV SOLUTION) ×1 IMPLANT
IV NS IRRIG 3000ML ARTHROMATIC (IV SOLUTION) ×2 IMPLANT
KIT RM TURNOVER CYSTO AR (KITS) ×2 IMPLANT
MANIFOLD NEPTUNE II (INSTRUMENTS) ×2 IMPLANT
NS IRRIG 500ML POUR BTL (IV SOLUTION) ×4 IMPLANT
PACK CYSTO (CUSTOM PROCEDURE TRAY) ×2 IMPLANT
STENT URET 6FRX26 CONTOUR (STENTS) ×2 IMPLANT
STRIP CLOSURE SKIN 1/2X4 (GAUZE/BANDAGES/DRESSINGS) IMPLANT
SYRINGE 10CC LL (SYRINGE) ×2 IMPLANT
TUBE CONNECTING 12X1/4 (SUCTIONS) ×2 IMPLANT

## 2017-10-02 NOTE — Interval H&P Note (Signed)
History and Physical Interval Note:  10/02/2017 8:14 AM  Jaime [name]  has presented today for surgery, with the diagnosis of LEFT URETEROPELVIC JUNCTION STONE  The various methods of treatment have been discussed with the patient and family. After consideration of risks, benefits and other options for treatment, the patient has consented to  Procedure(s) with comments: CYSTOSCOPY/URETEROSCOPY/HOLMIUM LASER/STENT PLACEMENT (Left) - ONLY NEEDS 45 MIN FOR PROCEDURE as a surgical intervention .  The patient's history has been reviewed, patient examined, no change in status, stable for surgery.  I have reviewed the patient's chart and labs.  Questions were answered to the patient's satisfaction.     Conception Oms Winter

## 2017-10-02 NOTE — Anesthesia Preprocedure Evaluation (Signed)
Anesthesia Evaluation  Patient identified by MRN, date of birth, ID band Patient awake    Reviewed: Allergy & Precautions, NPO status , Patient's Chart, lab work & pertinent test results  Airway Mallampati: III  TM Distance: >3 FB Neck ROM: Full    Dental  (+) Partial Upper, Poor Dentition,    Pulmonary    Pulmonary exam normal breath sounds clear to auscultation       Cardiovascular hypertension, Pt. on medications Normal cardiovascular exam Rhythm:Regular Rate:Normal     Neuro/Psych negative neurological ROS  negative psych ROS   GI/Hepatic negative GI ROS, Neg liver ROS,   Endo/Other  negative endocrine ROS  Renal/GU      Musculoskeletal   Abdominal Normal abdominal exam  (+)   Peds  Hematology HLD   Anesthesia Other Findings left ureteral stone  Reproductive/Obstetrics negative OB ROS                             Anesthesia Physical  Anesthesia Plan  ASA: II  Anesthesia Plan: General   Post-op Pain Management:    Induction: Intravenous  PONV Risk Score and Plan: 2 and Dexamethasone, Ondansetron and Treatment may vary due to age or medical condition  Airway Management Planned: LMA  Additional Equipment:   Intra-op Plan:   Post-operative Plan: Extubation in OR  Informed Consent: I have reviewed the patients History and Physical, chart, labs and discussed the procedure including the risks, benefits and alternatives for the proposed anesthesia with the patient or authorized representative who has indicated his/her understanding and acceptance.   Dental advisory given  Plan Discussed with: CRNA and Surgeon  Anesthesia Plan Comments:         Anesthesia Quick Evaluation

## 2017-10-02 NOTE — Transfer of Care (Signed)
Immediate Anesthesia Transfer of Care Note  Immediate Anesthesia Transfer of Care Note  Patient: Jaime [name]  Procedure(s) Performed: Procedure(s) (LRB): CYSTOSCOPY/URETEROSCOPY/HOLMIUM LASER/STENT PLACEMENT, STONE BASKETRY (Left)  Patient Location: PACU  Anesthesia Type: General  Level of Consciousness: awake, sedated, patient cooperative and responds to stimulation  Airway & Oxygen Therapy: Patient Spontanous Breathing and Patient connected to NCO2  Post-op Assessment: Report given to PACU RN, Post -op Vital signs reviewed and stable and Patient moving all extremities  Post vital signs: Reviewed and stable  Complications: No apparent anesthesia complications

## 2017-10-02 NOTE — Anesthesia Postprocedure Evaluation (Signed)
Anesthesia Post Note  Patient: Jaime [name]  Procedure(s) Performed: CYSTOSCOPY/URETEROSCOPY/HOLMIUM LASER/STENT PLACEMENT, STONE BASKETRY (Left )     Patient location during evaluation: PACU Anesthesia Type: General Level of consciousness: awake Pain management: pain level controlled Vital Signs Assessment: post-procedure vital signs reviewed and stable Respiratory status: spontaneous breathing Cardiovascular status: stable Postop Assessment: no apparent nausea or vomiting Anesthetic complications: no    Last Vitals:  Vitals:   10/02/17 1000 10/02/17 1015  BP: 127/63 123/64  Pulse: 63 68  Resp: 14 17  Temp:    SpO2: 95% 95%    Last Pain:  Vitals:   10/02/17 0625  TempSrc: Oral   Pain Goal: Patients Stated Pain Goal: 5 (10/02/17 0643)               Teneshia Hedeen JR,JOHN Mateo Flow

## 2017-10-02 NOTE — Op Note (Addendum)
Operative Note  Preoperative diagnosis:  1.  6 mm left UPJ stone 2.  UTI  Postoperative diagnosis: 1.  6 mm left UVJ stone 2.  Resolved UTI  Procedure(s): 1.  Cystoscopy 2.  Left JJ stent removal 3.  Left ureteroscopy 4.  Laser lithotripsy 5.  Left JJ stent placement  Surgeon: Ellison Hughs, MD  Assistants:  None  Anesthesia:  Gen LMA  Complications:  None  EBL:  <5 mL   Specimens: 1. Left ureteral stone  Drains/Catheters: 1.  Left 6 French JJ stent with tether secured to 18 French Foley catheter   Intraoperative findings:   1.  Left UVJ stone 2.  Trilobar prostatic obstruction with prominent median lobe  Indication:  Jaime [name] is a 84 y.o. male with an obstructing 6 mm left UPJ stone, s/p left ureteroscopy and stent placement on 09/23/17.  Following stent placement there, purlent urine was drained and the patient has been placed on a week long course of cipro.  He went into urinary retention following surgery and has had an indwelling Foley catheter in since 09/25/17.  He currently denies flank pain, hematuria, fever/chills or nausea/vomiting.  He is here today for definitive stone treatment  Description of procedure:  After informed consent was obtained, the patient was brought to the operating room and general LMA anesthesia was administered. The patient was then placed in the dorsolithotomy position and prepped and draped in usual sterile fashion. A timeout was performed. A 21 French rigid cystoscope was then inserted into the urethral meatus and advanced into the bladder under direct vision. A complete bladder survey revealed no intravesical pathology.  His previously placed left JJ stent was grasped and retracted to the urethral meatus. A sensor wire was then advanced through the lumen of the stent up to the left renal pelvis, under fluoroscopic guidance. The stent was then removed over the wire, inspected and discarded. A semirigid ureteroscope was then  advanced into the distal aspects of the left ureter where his 6 mm stone was identified. A 365  holmium laser was then used to fracture the stone into numerous smaller fragments. A tipples basket was then used to extract all stone fragments from the lumen of the left ureter.  The rigid cystoscope was then exchanged for a rigid cystoscope and a 6 [place] JJ stent was then placed over the wire and into good position within the left collecting system, confirming placement via fluoroscopy. The tether the stent was left intact. All stone fragments were then removed from the bladder. An 28 French Foley catheter was then placed with 10 mL sterile water in the balloon and placed to gravity drainage. He tolerated the procedure well and was transferred to the postanesthesia in stable condition.  Plan:  Remove Foley catheter and left JJ stent (secured to the Foley) at 6 AM on 10/04/17.  Follow-up in 6 weeks with a renal ultrasound

## 2017-10-02 NOTE — Discharge Instructions (Signed)
[facility] °[phone] °Post Ureteroscopy With or Without Stent Instructions ° °Definitions: ° °Ureter: The duct that transports urine from the kidney to the bladder. °Stent:   A plastic hollow tube that is placed into the ureter, from the kidney to the bladder to prevent the ureter from swelling shut. ° °GENERAL INSTRUCTIONS: ° °Despite the fact that no skin incisions were used, the area around the ureter and bladder is raw and irritated. The stent is a foreign body which will further irritate the bladder wall. This irritation is manifested by increased frequency of urination, both day and night, and by an increase in the urge to urinate. In some, the urge to urinate is present almost always. Sometimes the urge is strong enough that you may not be able to stop yourself from urinating. The only real cure is to remove the stent and then give time for the bladder wall to heal which can't be done until the danger of the ureter swelling shut has passed, which varies. ° °You may see some blood in your urine while the stent is in place and a few days afterwards. Do not be alarmed, even if the urine was clear for a while. Get off your feet and drink lots of fluids until clearing occurs. If you start to pass clots or don't improve, call us. ° °DIET: °You may return to your normal diet immediately. Because of the raw surface of your bladder, alcohol, spicy foods, acid type foods and drinks with caffeine may cause irritation or frequency and should be used in moderation. To keep your urine flowing freely and to avoid constipation, drink plenty of fluids during the day ( 8-10 glasses ). °Tip: Avoid cranberry juice because it is very acidic. ° °ACTIVITY: °Your physical activity doesn't need to be restricted. However, if you are very active, you may see some blood in your urine. We suggest that you reduce your activity under these circumstances until the bleeding has stopped. ° °BOWELS: °It is important to  keep your bowels regular during the postoperative period. Straining with bowel movements can cause bleeding. A bowel movement every other day is reasonable. Use a mild laxative if needed, such as Milk of Magnesia 2-3 tablespoons, or 2 Dulcolax tablets. Call if you continue to have problems. If you have been taking narcotics for pain, before, during or after your surgery, you may be constipated. Take a laxative if necessary. ° ° °MEDICATION: °You should resume your pre-surgery medications unless told not to. In addition you will often be given an antibiotic to prevent infection. These should be taken as prescribed until the bottles are finished unless you are having an unusual reaction to one of the drugs. ° °PROBLEMS YOU SHOULD REPORT TO US: °Fevers over 100.5 Fahrenheit. °Heavy bleeding, or clots ( See above notes about blood in urine ). °Inability to urinate. °Drug reactions ( hives, rash, nausea, vomiting, diarrhea ). °Severe burning or pain with urination that is not improving. ° °FOLLOW-UP: °You will need a follow-up appointment to monitor your progress. Call for this appointment at the number listed above. Usually the first appointment will be about three to fourteen days after your surgery. ° ° ° °  ° ° ° °Post Anesthesia Home Care Instructions ° °Activity: °Get plenty of rest for the remainder of the day. A responsible individual must stay with you for 24 hours following the procedure.  °For the next 24 hours, DO NOT: °-Drive a car °-Operate machinery °-Drink alcoholic beverages °-Take any medication   unless instructed by your physician °-Make any legal decisions or sign important papers. ° °Meals: °Start with liquid foods such as gelatin or soup. Progress to regular foods as tolerated. Avoid greasy, spicy, heavy foods. If nausea and/or vomiting occur, drink only clear liquids until the nausea and/or vomiting subsides. Call your physician if vomiting continues. ° °Special Instructions/Symptoms: °Your throat  may feel dry or sore from the anesthesia or the breathing tube placed in your throat during surgery. If this causes discomfort, gargle with warm salt water. The discomfort should disappear within 24 hours. ° °

## 2017-10-02 NOTE — Anesthesia Procedure Notes (Signed)
Procedure Name: LMA Insertion Date/Time: 10/02/2017 8:35 AM Performed by: Justice Rocher, CRNA Pre-anesthesia Checklist: Patient identified, Emergency Drugs available, Suction available and Patient being monitored Patient Re-evaluated:Patient Re-evaluated prior to induction Oxygen Delivery Method: Circle system utilized Preoxygenation: Pre-oxygenation with 100% oxygen Induction Type: IV induction Ventilation: Mask ventilation without difficulty LMA: LMA inserted LMA Size: 4.0 Number of attempts: 1 Airway Equipment and Method: Bite block Placement Confirmation: positive ETCO2 and breath sounds checked- equal and bilateral Tube secured with: Tape Dental Injury: Teeth and Oropharynx as per pre-operative assessment

## 2017-10-03 ENCOUNTER — Encounter (HOSPITAL_BASED_OUTPATIENT_CLINIC_OR_DEPARTMENT_OTHER): Payer: Self-pay | Admitting: Urology

## 2017-10-10 DIAGNOSIS — R8279 Other abnormal findings on microbiological examination of urine: Secondary | ICD-10-CM | POA: Diagnosis not present

## 2017-10-10 DIAGNOSIS — N3000 Acute cystitis without hematuria: Secondary | ICD-10-CM | POA: Diagnosis not present

## 2017-10-19 DIAGNOSIS — J069 Acute upper respiratory infection, unspecified: Secondary | ICD-10-CM | POA: Diagnosis not present

## 2017-10-19 DIAGNOSIS — R110 Nausea: Secondary | ICD-10-CM | POA: Diagnosis not present

## 2017-10-24 DIAGNOSIS — R5383 Other fatigue: Secondary | ICD-10-CM | POA: Diagnosis not present

## 2017-10-24 DIAGNOSIS — N390 Urinary tract infection, site not specified: Secondary | ICD-10-CM | POA: Diagnosis not present

## 2017-10-24 DIAGNOSIS — Z6823 Body mass index (BMI) 23.0-23.9, adult: Secondary | ICD-10-CM | POA: Diagnosis not present

## 2017-10-24 DIAGNOSIS — I10 Essential (primary) hypertension: Secondary | ICD-10-CM | POA: Diagnosis not present

## 2017-10-24 DIAGNOSIS — R509 Fever, unspecified: Secondary | ICD-10-CM | POA: Diagnosis not present

## 2017-10-24 DIAGNOSIS — I2510 Atherosclerotic heart disease of native coronary artery without angina pectoris: Secondary | ICD-10-CM | POA: Diagnosis not present

## 2017-10-24 DIAGNOSIS — Z1389 Encounter for screening for other disorder: Secondary | ICD-10-CM | POA: Diagnosis not present

## 2017-10-24 DIAGNOSIS — R630 Anorexia: Secondary | ICD-10-CM | POA: Diagnosis not present

## 2017-10-24 DIAGNOSIS — R7309 Other abnormal glucose: Secondary | ICD-10-CM | POA: Diagnosis not present

## 2017-10-24 DIAGNOSIS — E875 Hyperkalemia: Secondary | ICD-10-CM | POA: Diagnosis not present

## 2017-10-24 DIAGNOSIS — E748 Other specified disorders of carbohydrate metabolism: Secondary | ICD-10-CM | POA: Diagnosis not present

## 2017-10-24 DIAGNOSIS — R69 Illness, unspecified: Secondary | ICD-10-CM | POA: Diagnosis not present

## 2017-10-24 DIAGNOSIS — N209 Urinary calculus, unspecified: Secondary | ICD-10-CM | POA: Diagnosis not present

## 2017-10-24 DIAGNOSIS — J418 Mixed simple and mucopurulent chronic bronchitis: Secondary | ICD-10-CM | POA: Diagnosis not present

## 2017-10-29 DIAGNOSIS — E748 Other specified disorders of carbohydrate metabolism: Secondary | ICD-10-CM | POA: Diagnosis not present

## 2017-10-29 DIAGNOSIS — E875 Hyperkalemia: Secondary | ICD-10-CM | POA: Diagnosis not present

## 2017-11-14 DIAGNOSIS — Z87442 Personal history of urinary calculi: Secondary | ICD-10-CM | POA: Diagnosis not present

## 2017-11-14 DIAGNOSIS — N201 Calculus of ureter: Secondary | ICD-10-CM | POA: Diagnosis not present

## 2017-11-14 DIAGNOSIS — R338 Other retention of urine: Secondary | ICD-10-CM | POA: Diagnosis not present

## 2017-11-14 DIAGNOSIS — N401 Enlarged prostate with lower urinary tract symptoms: Secondary | ICD-10-CM | POA: Diagnosis not present

## 2018-01-21 DIAGNOSIS — R69 Illness, unspecified: Secondary | ICD-10-CM | POA: Diagnosis not present

## 2018-02-21 DIAGNOSIS — H269 Unspecified cataract: Secondary | ICD-10-CM | POA: Diagnosis not present

## 2018-02-21 DIAGNOSIS — H524 Presbyopia: Secondary | ICD-10-CM | POA: Diagnosis not present

## 2018-02-27 DIAGNOSIS — J329 Chronic sinusitis, unspecified: Secondary | ICD-10-CM | POA: Diagnosis not present

## 2018-02-27 DIAGNOSIS — N400 Enlarged prostate without lower urinary tract symptoms: Secondary | ICD-10-CM | POA: Diagnosis not present

## 2018-02-27 DIAGNOSIS — E663 Overweight: Secondary | ICD-10-CM | POA: Diagnosis not present

## 2018-02-27 DIAGNOSIS — I10 Essential (primary) hypertension: Secondary | ICD-10-CM | POA: Diagnosis not present

## 2018-02-27 DIAGNOSIS — Z23 Encounter for immunization: Secondary | ICD-10-CM | POA: Diagnosis not present

## 2018-02-27 DIAGNOSIS — Z6825 Body mass index (BMI) 25.0-25.9, adult: Secondary | ICD-10-CM | POA: Diagnosis not present

## 2018-06-09 ENCOUNTER — Ambulatory Visit (INDEPENDENT_AMBULATORY_CARE_PROVIDER_SITE_OTHER): Payer: Medicare HMO

## 2018-06-09 ENCOUNTER — Ambulatory Visit: Payer: Medicare HMO | Admitting: Orthopedic Surgery

## 2018-06-09 ENCOUNTER — Encounter: Payer: Self-pay | Admitting: Orthopedic Surgery

## 2018-06-09 ENCOUNTER — Encounter

## 2018-06-09 VITALS — BP 130/83 | HR 90 | Ht 68.0 in | Wt 163.0 lb

## 2018-06-09 DIAGNOSIS — G8929 Other chronic pain: Secondary | ICD-10-CM | POA: Diagnosis not present

## 2018-06-09 DIAGNOSIS — M5137 Other intervertebral disc degeneration, lumbosacral region: Secondary | ICD-10-CM | POA: Diagnosis not present

## 2018-06-09 DIAGNOSIS — M5442 Lumbago with sciatica, left side: Secondary | ICD-10-CM

## 2018-06-09 MED ORDER — IBUPROFEN 400 MG PO TABS: 400.0000 mg | ORAL_TABLET | Freq: Three times a day (TID) | ORAL | 0 refills | Status: DC | PRN

## 2018-06-09 NOTE — Patient Instructions (Addendum)
Take medication as ordered  Try the exercises listed below  Return in 6 weeks  Chronic Back Pain When back pain lasts longer than 3 months, it is called chronic back pain.The cause of your back pain may not be known. Some common causes include:  Wear and tear (degenerative disease) of the bones, ligaments, or disks in your back.  Inflammation and stiffness in your back (arthritis).  People who have chronic back pain often go through certain periods in which the pain is more intense (flare-ups). Many people can learn to manage the pain with home care. Follow these instructions at home: Pay attention to any changes in your symptoms. Take these actions to help with your pain: Activity  Avoid bending and activities that make the problem worse.  Do not sit or stand in one place for long periods of time.  Take brief periods of rest throughout the day. This will reduce your pain. Resting in a lying or standing position is usually better than sitting to rest.  When you are resting for longer periods, mix in some mild activity or stretching between periods of rest. This will help to prevent stiffness and pain.  Get regular exercise. Ask your health care provider what activities are safe for you.  Do not lift anything that is heavier than 10 lb (4.5 kg). Always use proper lifting technique, which includes: ? Bending your knees. ? Keeping the load close to your body. ? Avoiding twisting. Managing pain  If directed, apply ice to the painful area. Your health care provider may recommend applying ice during the first 24-48 hours after a flare-up begins. ? Put ice in a plastic bag. ? Place a towel between your skin and the bag. ? Leave the ice on for 20 minutes, 2-3 times per day.  After icing, apply heat to the affected area as often as told by your health care provider. Use the heat source that your health care provider recommends, such as a moist heat pack or a heating pad. ? Place a  towel between your skin and the heat source. ? Leave the heat on for 20-30 minutes. ? Remove the heat if your skin turns bright red. This is especially important if you are unable to feel pain, heat, or cold. You may have a greater risk of getting burned.  Try soaking in a warm tub.  Take over-the-counter and prescription medicines only as told by your health care provider.  Keep all follow-up visits as told by your health care provider. This is important. Contact a health care provider if:  You have pain that is not relieved with rest or medicine. Get help right away if:  You have weakness or numbness in one or both of your legs or feet.  You have trouble controlling your bladder or your bowels.  You have nausea or vomiting.  You have pain in your abdomen.  You have shortness of breath or you faint. This information is not intended to replace advice given to you by your health care provider. Make sure you discuss any questions you have with your health care provider. Document Released: 10/11/2004 Document Revised: 01/12/2016 Document Reviewed: 02/21/2015 Elsevier Interactive Patient Education  2018 [employer].  Back Exercises The following exercises strengthen the muscles that help to support the back. They also help to keep the lower back flexible. Doing these exercises can help to prevent back pain or lessen existing pain. If you have back pain or discomfort, try doing these exercises 2-3  times each day or as told by your health care provider. When the pain goes away, do them once each day, but increase the number of times that you repeat the steps for each exercise (do more repetitions). If you do not have back pain or discomfort, do these exercises once each day or as told by your health care provider. Exercises Single Knee to Chest  Repeat these steps 3-5 times for each leg: 1. Lie on your back on a firm bed or the floor with your legs extended. 2. Bring one knee to your  chest. Your other leg should stay extended and in contact with the floor. 3. Hold your knee in place by grabbing your knee or thigh. 4. Pull on your knee until you feel a gentle stretch in your lower back. 5. Hold the stretch for 10-30 seconds. 6. Slowly release and straighten your leg.  Pelvic Tilt  Repeat these steps 5-10 times: 1. Lie on your back on a firm bed or the floor with your legs extended. 2. Bend your knees so they are pointing toward the ceiling and your feet are flat on the floor. 3. Tighten your lower abdominal muscles to press your lower back against the floor. This motion will tilt your pelvis so your tailbone points up toward the ceiling instead of pointing to your feet or the floor. 4. With gentle tension and even breathing, hold this position for 5-10 seconds.  Cat-Cow  Repeat these steps until your lower back becomes more flexible: 1. Get into a hands-and-knees position on a firm surface. Keep your hands under your shoulders, and keep your knees under your hips. You may place padding under your knees for comfort. 2. Let your head hang down, and point your tailbone toward the floor so your lower back becomes rounded like the back of a cat. 3. Hold this position for 5 seconds. 4. Slowly lift your head and point your tailbone up toward the ceiling so your back forms a sagging arch like the back of a cow. 5. Hold this position for 5 seconds.  Press-Ups  Repeat these steps 5-10 times: 1. Lie on your abdomen (face-down) on the floor. 2. Place your palms near your head, about shoulder-width apart. 3. While you keep your back as relaxed as possible and keep your hips on the floor, slowly straighten your arms to raise the top half of your body and lift your shoulders. Do not use your back muscles to raise your upper torso. You may adjust the placement of your hands to make yourself more comfortable. 4. Hold this position for 5 seconds while you keep your back  relaxed. 5. Slowly return to lying flat on the floor.  Bridges  Repeat these steps 10 times: 1. Lie on your back on a firm surface. 2. Bend your knees so they are pointing toward the ceiling and your feet are flat on the floor. 3. Tighten your buttocks muscles and lift your buttocks off of the floor until your waist is at almost the same height as your knees. You should feel the muscles working in your buttocks and the back of your thighs. If you do not feel these muscles, slide your feet 1-2 inches farther away from your buttocks. 4. Hold this position for 3-5 seconds. 5. Slowly lower your hips to the starting position, and allow your buttocks muscles to relax completely.  If this exercise is too easy, try doing it with your arms crossed over your chest. Abdominal Crunches  Repeat these steps 5-10 times: 1. Lie on your back on a firm bed or the floor with your legs extended. 2. Bend your knees so they are pointing toward the ceiling and your feet are flat on the floor. 3. Cross your arms over your chest. 4. Tip your chin slightly toward your chest without bending your neck. 5. Tighten your abdominal muscles and slowly raise your trunk (torso) high enough to lift your shoulder blades a tiny bit off of the floor. Avoid raising your torso higher than that, because it can put too much stress on your low back and it does not help to strengthen your abdominal muscles. 6. Slowly return to your starting position.  Back Lifts Repeat these steps 5-10 times: 1. Lie on your abdomen (face-down) with your arms at your sides, and rest your forehead on the floor. 2. Tighten the muscles in your legs and your buttocks. 3. Slowly lift your chest off of the floor while you keep your hips pressed to the floor. Keep the back of your head in line with the curve in your back. Your eyes should be looking at the floor. 4. Hold this position for 3-5 seconds. 5. Slowly return to your starting position.  Contact  a health care provider if:  Your back pain or discomfort gets much worse when you do an exercise.  Your back pain or discomfort does not lessen within 2 hours after you exercise. If you have any of these problems, stop doing these exercises right away. Do not do them again unless your health care provider says that you can. Get help right away if:  You develop sudden, severe back pain. If this happens, stop doing the exercises right away. Do not do them again unless your health care provider says that you can. This information is not intended to replace advice given to you by your health care provider. Make sure you discuss any questions you have with your health care provider. Document Released: 10/11/2004 Document Revised: 01/11/2016 Document Reviewed: 10/28/2014 Elsevier Interactive Patient Education  2017 [employer].

## 2018-06-09 NOTE — Progress Notes (Signed)
Established patient new problem   Chief Complaint  Patient presents with  . Back Pain    Back pain with left leg pain.    84 year old male presents for evaluation of left-sided lower back hip and left leg pain for about 4 weeks  Location left hip left leg lower back duration 4 weeks severity mild quality dull ache with occasional associated numbness tingling and weakness left leg and foot     Review of Systems  Constitutional: Negative for chills, fever, malaise/fatigue and weight loss.  Gastrointestinal: Negative for constipation.       Denies loss bowel control   Genitourinary:       Denies urinary retention or los of bladder control    Denies night pain unexplained weight loss pain unrelieved by positional change no fever  Past Medical History:  Diagnosis Date  . BPH (benign prostatic hyperplasia)   . Coronary atherosclerosis of native coronary artery    Minimal coronary atherosclerosis at catheterization 2003  . Diverticulosis of colon   . Exertional dyspnea   . History of colonic polyps   . History of exercise stress test 05-31-2011   dr Lattie Haw   adequate but abnormal graded exercise test revealing reasonably good exercise capacity, no chest discomfort or other ischemic symptoms, borderline hypertensive blood pressure response and an ecg response consistent with myocardial ishcemia , but with very rapid resolution after the cessation of exercise suggesting a possible false positive ecg response   . History of kidney stones   . Left ureteral stone   . Mixed hyperlipidemia   . Nephrolithiasis   . Osteoarthritis   . Wears glasses   . Wears hearing aid in both ears   . Wears partial dentures    upper only    Past Surgical History:  Procedure Laterality Date  . CARDIAC CATHETERIZATION  02-27-2002  dr hochrein   minimal coronary plaquing , normal lvf  . CYSTOSCOPY W/ URETERAL STENT PLACEMENT Left 09/23/2017   Procedure: CYSTOSCOPY WITH RETROGRADE PYELOGRAM/URETERAL  LEFT STENT PLACEMENT;  Surgeon: Ceasar Mons, MD;  Location: [facility];  Service: Urology;  Laterality: Left;  . CYSTOSCOPY/URETEROSCOPY/HOLMIUM LASER/STENT PLACEMENT Left 10/02/2017   Procedure: CYSTOSCOPY/URETEROSCOPY/HOLMIUM LASER/STENT PLACEMENT, STONE BASKETRY;  Surgeon: Ceasar Mons, MD;  Location: [facility];  Service: Urology;  Laterality: Left;  ONLY NEEDS 45 MIN FOR PROCEDURE  . EXTRACORPOREAL SHOCK WAVE LITHOTRIPSY  2002  . TRANSTHORACIC ECHOCARDIOGRAM  05/31/2011   dr s. Domenic Polite   mild concentric LVH, ef 60-65%/  moderately calcified AV w/ mild AV stenosis, no regurg. (mean grandient 35mmHg, peak gradient 40mmHg)/ trivial TR    Family History  Problem Relation Age of Onset  . Cancer Father        Bone cancer   Social History   Tobacco Use  . Smoking status: Never Smoker  . Smokeless tobacco: Current User    Types: Chew  . Tobacco comment: chew tobacco for 30 years (as of 09-30-2017)  Substance Use Topics  . Alcohol use: No  . Drug use: No    No Known Allergies  Current Meds  Medication Sig  . amLODipine (NORVASC) 5 MG tablet Take 5 mg by mouth daily.  Marland Kitchen aspirin EC 81 MG tablet Take 81 mg by mouth daily.  Marland Kitchen atorvastatin (LIPITOR) 40 MG tablet Take 40 mg by mouth daily.  . mirtazapine (REMERON) 15 MG tablet   . tamsulosin (FLOMAX) 0.4 MG CAPS capsule Take 0.4 mg by mouth daily.    BP  130/83   Pulse 90   Ht 5\' 8"  (1.727 m)   Wt 163 lb (73.9 kg)   BMI 24.78 kg/m   Physical Exam  Constitutional: He is oriented to person, place, and time. He appears well-developed and well-nourished.  Vital signs have been reviewed and are stable. Gen. appearance the patient is well-developed and well-nourished with normal grooming and hygiene.   Neurological: He is alert and oriented to person, place, and time.  Skin: Skin is warm and dry. No erythema.  Psychiatric: He has a normal mood and affect.  Vitals reviewed.   Ortho Exam  Back  exam nontender cervical thoracic and lumbar spine mild asymmetry with scoliosis normal flexion extension rotation side bending without pain on flexion he could reach ankle area.  Muscle tone normal skin normal  Left and right hip show mild decreased internal rotation but normal external rotation flexion abduction and abduction with no tenderness both hips are stable dorsiflexion and plantarflexion strength in both legs and feet were normal skin was normal in both legs distal pulses were intact bilaterally and normal sensation to pinprick with 2+ knee reflexes 1+ ankle reflexes downgoing toes bilaterally  MEDICAL DECISION SECTION  Xrays were done at Office see report x-ray shows extensive degenerative disc disease primarily lumbar left-sided scoliosis   Encounter Diagnoses  Name Primary?  . Chronic left-sided low back pain with left-sided sciatica   . DDD (degenerative disc disease), lumbosacral Yes    PLAN: (Rx., injectx, surgery, frx, mri/ct) Recommend physician directed exercise program 6 weeks of ibuprofen  Follow-up in 6 weeks  Meds ordered this encounter  Medications  . ibuprofen (ADVIL,MOTRIN) 400 MG tablet    Sig: Take 1 tablet (400 mg total) by mouth every 8 (eight) hours as needed.    Dispense:  90 tablet    Refill:  0    Arther Abbott, MD  06/09/2018 10:53 AM

## 2018-06-27 DIAGNOSIS — R69 Illness, unspecified: Secondary | ICD-10-CM | POA: Diagnosis not present

## 2018-07-21 ENCOUNTER — Ambulatory Visit: Payer: Medicare HMO | Admitting: Orthopedic Surgery

## 2018-07-21 VITALS — BP 133/73 | HR 71 | Ht 68.0 in | Wt 168.0 lb

## 2018-07-21 DIAGNOSIS — M5442 Lumbago with sciatica, left side: Secondary | ICD-10-CM

## 2018-07-21 DIAGNOSIS — G8929 Other chronic pain: Secondary | ICD-10-CM

## 2018-07-21 NOTE — Patient Instructions (Signed)

## 2018-07-21 NOTE — Progress Notes (Signed)
Progress Note   Patient ID: Jaime [name], male   DOB: 1932/10/11, 84 y.o.   MRN: [id number]   Chief Complaint  Patient presents with  . Follow-up    Recheck on left hip and back.    84 year old male presents for reevaluation of his lower back and left-sided hip pain which he saw [place] for a few weeks back.  He says he has not had any significant change in his symptoms which tend to be intermittent  He has a mild dull ache on the left side of his lower back and upper iliac crest and hip without numbness or tingling or weakness in the left leg or foot       Review of Systems  Neurological: Negative for tingling, sensory change and focal weakness.     No Known Allergies   BP 133/73   Pulse 71   Ht 5\' 8"  (1.727 m)   Wt 168 lb (76.2 kg)   BMI 25.54 kg/m   Physical Exam   Medical decisions:   Data  Imaging:   N/A  Encounter Diagnosis  Name Primary?  . Chronic left-sided low back pain with left-sided sciatica Yes    PLAN:   Physical therapy will be ordered.  Follow-up as needed.    Arther Abbott, MD 07/21/2018 9:28 AM

## 2018-07-22 DIAGNOSIS — Z0001 Encounter for general adult medical examination with abnormal findings: Secondary | ICD-10-CM | POA: Diagnosis not present

## 2018-07-22 DIAGNOSIS — H6591 Unspecified nonsuppurative otitis media, right ear: Secondary | ICD-10-CM | POA: Diagnosis not present

## 2018-07-22 DIAGNOSIS — G5702 Lesion of sciatic nerve, left lower limb: Secondary | ICD-10-CM | POA: Diagnosis not present

## 2018-07-22 DIAGNOSIS — N400 Enlarged prostate without lower urinary tract symptoms: Secondary | ICD-10-CM | POA: Diagnosis not present

## 2018-07-22 DIAGNOSIS — M5417 Radiculopathy, lumbosacral region: Secondary | ICD-10-CM | POA: Diagnosis not present

## 2018-07-22 DIAGNOSIS — Z1389 Encounter for screening for other disorder: Secondary | ICD-10-CM | POA: Diagnosis not present

## 2018-07-22 DIAGNOSIS — H9113 Presbycusis, bilateral: Secondary | ICD-10-CM | POA: Diagnosis not present

## 2018-07-22 DIAGNOSIS — M47816 Spondylosis without myelopathy or radiculopathy, lumbar region: Secondary | ICD-10-CM | POA: Diagnosis not present

## 2018-07-22 DIAGNOSIS — E663 Overweight: Secondary | ICD-10-CM | POA: Diagnosis not present

## 2018-07-25 ENCOUNTER — Other Ambulatory Visit: Payer: Self-pay

## 2018-07-25 ENCOUNTER — Ambulatory Visit (HOSPITAL_COMMUNITY): Payer: Medicare HMO | Attending: Orthopedic Surgery

## 2018-07-25 ENCOUNTER — Encounter (HOSPITAL_COMMUNITY): Payer: Self-pay

## 2018-07-25 DIAGNOSIS — Z1389 Encounter for screening for other disorder: Secondary | ICD-10-CM | POA: Diagnosis not present

## 2018-07-25 DIAGNOSIS — E663 Overweight: Secondary | ICD-10-CM | POA: Diagnosis not present

## 2018-07-25 DIAGNOSIS — R262 Difficulty in walking, not elsewhere classified: Secondary | ICD-10-CM

## 2018-07-25 DIAGNOSIS — Z6826 Body mass index (BMI) 26.0-26.9, adult: Secondary | ICD-10-CM | POA: Diagnosis not present

## 2018-07-25 DIAGNOSIS — Z0001 Encounter for general adult medical examination with abnormal findings: Secondary | ICD-10-CM | POA: Diagnosis not present

## 2018-07-25 DIAGNOSIS — M25552 Pain in left hip: Secondary | ICD-10-CM | POA: Diagnosis not present

## 2018-07-25 DIAGNOSIS — Z0000 Encounter for general adult medical examination without abnormal findings: Secondary | ICD-10-CM | POA: Diagnosis not present

## 2018-07-25 DIAGNOSIS — M6281 Muscle weakness (generalized): Secondary | ICD-10-CM | POA: Diagnosis not present

## 2018-07-25 NOTE — Therapy (Signed)
[place], [place], [address] Phone: [phone]   Fax:  [phone]  Physical Therapy Evaluation  Patient Details  Name: Jaime [name] MRN: [id number] Date of Birth: 10-13-1932 Referring Provider (PT): Arther Abbott, MD   Encounter Date: 07/25/2018  PT End of Session - 07/25/18 1603    Visit Number  1    Number of Visits  9    Date for PT Re-Evaluation  08/22/18    Authorization Type  [employer] HMO    Authorization Time Period  07/25/18 to 08/22/18    Authorization - Visit Number  1    Authorization - Number of Visits  10    PT Start Time  1700    PT Stop Time  1416    PT Time Calculation (min)  34 min    Activity Tolerance  Patient tolerated treatment well    Behavior During Therapy  [facility] for tasks assessed/performed       Past Medical History:  Diagnosis Date  . BPH (benign prostatic hyperplasia)   . Coronary atherosclerosis of native coronary artery    Minimal coronary atherosclerosis at catheterization 2003  . Diverticulosis of colon   . Exertional dyspnea   . History of colonic polyps   . History of exercise stress test 05-31-2011   dr Lattie Haw   adequate but abnormal graded exercise test revealing reasonably good exercise capacity, no chest discomfort or other ischemic symptoms, borderline hypertensive blood pressure response and an ecg response consistent with myocardial ishcemia , but with very rapid resolution after the cessation of exercise suggesting a possible false positive ecg response   . History of kidney stones   . Left ureteral stone   . Mixed hyperlipidemia   . Nephrolithiasis   . Osteoarthritis   . Wears glasses   . Wears hearing aid in both ears   . Wears partial dentures    upper only    Past Surgical History:  Procedure Laterality Date  . CARDIAC CATHETERIZATION  02-27-2002  dr hochrein   minimal coronary plaquing , normal lvf  . CYSTOSCOPY W/ URETERAL STENT PLACEMENT  Left 09/23/2017   Procedure: CYSTOSCOPY WITH RETROGRADE PYELOGRAM/URETERAL LEFT STENT PLACEMENT;  Surgeon: Ceasar Mons, MD;  Location: [facility];  Service: Urology;  Laterality: Left;  . CYSTOSCOPY/URETEROSCOPY/HOLMIUM LASER/STENT PLACEMENT Left 10/02/2017   Procedure: CYSTOSCOPY/URETEROSCOPY/HOLMIUM LASER/STENT PLACEMENT, STONE BASKETRY;  Surgeon: Ceasar Mons, MD;  Location: [facility];  Service: Urology;  Laterality: Left;  ONLY NEEDS 45 MIN FOR PROCEDURE  . EXTRACORPOREAL SHOCK WAVE LITHOTRIPSY  2002  . TRANSTHORACIC ECHOCARDIOGRAM  05/31/2011   dr s. Domenic Polite   mild concentric LVH, ef 60-65%/  moderately calcified AV w/ mild AV stenosis, no regurg. (mean grandient 13mmHg, peak gradient 2mmHg)/ trivial TR    There were no vitals filed for this visit.   Subjective Assessment - 07/25/18 1347    Subjective  Pt reports having L hip pain off and on for the last 2 months. He states he had this same pain several years ago and he states he was told it was sciatica. If he is sitting, he doesn't feel it but if he push mows his yard, he feels it. He states it will go down his leg and his L foot can feel numb or tingly sometimes. He reports his pain goes down the anterolateral portion of his L thigh, not posterior. He reports anytime he is walking he feels it; he can  walk various times before it starts (15-30 mins). He reports it as aching heat in his L hip. He denies any LBP and denies any h/o LBP. PT helped his pain the last time he had this.     Limitations  Walking    How long can you sit comfortably?  no issues    How long can you stand comfortably?  5-59mins    How long can you walk comfortably?  15-30 mins, varies    Patient Stated Goals  reduce pain    Currently in Pain?  No/denies         [facility] PT Assessment - 07/25/18 0001      Assessment   Medical Diagnosis  chronic L-sided LBP with L-sided sciatica    Referring Provider (PT)  Arther Abbott, MD     Onset Date/Surgical Date  --   2 months ago; h/o same thing years ago   Next MD Visit  no f/u scheduled at this time    Prior Therapy  yes for same thing      Balance Screen   Has the patient fallen in the past 6 months  No    Has the patient had a decrease in activity level because of a fear of falling?   No    Is the patient reluctant to leave their home because of a fear of falling?   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)      Observation/Other Assessments   Focus on Therapeutic Outcomes ([place])   to be completed next visit      ROM / Strength   AROM / PROM / Strength  AROM;Strength      AROM   AROM Assessment Site  Lumbar    Lumbar Flexion  WFL    Lumbar Extension  WFL    Lumbar - Right Side Bend  WFL    Lumbar - Left Side Bend  WFL, tighter than R however    Lumbar - Right Rotation  WFL    Lumbar - Left Rotation  [facility]      Strength   Strength Assessment Site  Hip;Knee;Ankle    Right Hip Flexion  5/5    Right Hip Extension  3+/5    Right Hip ABduction  4/5    Left Hip Flexion  5/5    Left Hip Extension  3+/5    Left Hip ABduction  4/5    Right Knee Flexion  4+/5    Right Knee Extension  5/5    Left Knee Flexion  4+/5    Left Knee Extension  5/5    Right Ankle Dorsiflexion  4+/5    Left Ankle Dorsiflexion  4/5      Palpation   Palpation comment  mild-mod restrictions thorughout lumbar paraspinals, hip mm, TFL, quads; none were tender to palpation nor recreated his same pain      Ambulation/Gait   Ambulation Distance (Feet)  596 Feet    Assistive device  None    Gait Pattern  Step-through pattern;Trendelenburg    Gait Comments  L foot landed louder than R with heel strike > foot flat, indicating L anterior tib eccentric control/weakness      Balance   Balance Assessed  Yes  Static Standing Balance    Static Standing - Balance Support  No upper extremity supported    Static Standing Balance -  Activities   Single Leg Stance - Right Leg;Single Leg Stance - Left Leg    Static Standing - Comment/# of Minutes  R: 4.67sec or <, L: 1.5sec or <      Standardized Balance Assessment   Standardized Balance Assessment  Five Times Sit to Stand    Five times sit to stand comments   10.2sec          Objective measurements completed on examination: See above findings.       PT Education - 07/25/18 1603    Education Details  exam findings, POC, HEP    Person(s) Educated  Patient    Methods  Explanation;Demonstration    Comprehension  Verbalized understanding;Returned demonstration       PT Short Term Goals - 07/25/18 1611      PT SHORT TERM GOAL #1   Title  Pt will have improved glute max/hip ext MMT to 4/5 in order to demo improved strength.    Time  2    Period  Weeks    Status  New    Target Date  08/08/18      PT SHORT TERM GOAL #2   Title  Pt will report being able to perform yard work for 1 hour without increases in L hip pain to demo improved gluteal mm endurance and tolerance to functional tasks.    Time  4    Period  Weeks    Status  New      PT SHORT TERM GOAL #3   Title  Pt will be able to perform bil SLS for 6 sec in order to demo improved core and hip strength and to maximize gait.    Time  2    Period  Weeks    Status  New        PT Long Term Goals - 07/25/18 1611      PT LONG TERM GOAL #1   Title  Pt will have improved proximal hip mm to 4+/5 or better (no reports of pain with L hip abd) and 5/5 bil ankle strength in order to maximize standing and walking tolerance.    Time  4    Period  Weeks    Status  New    Target Date  08/22/18      PT LONG TERM GOAL #2   Title  Pt will be able to perform bil SLS for 12 sec or > in order to maximize gait on uneven ground and further demo improved core and hip strength.     Time  4    Period  Weeks    Status  New       PT LONG TERM GOAL #3   Title  Pt will be able to stand for 30 mins or > without increases in L hip pain to demo improved hip strength and endurance.     Time  4    Period  Weeks    Status  New      PT LONG TERM GOAL #4   Title  Pt will report being able to perform yard work for 2 hours or > without increases in L hip pain to further demo improved gluteal endurance and tolerance to WB, functional tasks.    Time  4    Period  Weeks    Status  New  Plan - 07/25/18 1604    Clinical Impression Statement  Pt is very pleasant 84YO M who presents to OPPT with c/o L hip pain. He has h/o of this pain years ago which was treated with PT and it recently reoccurred about 2 months ago. His pain is worst with standing and walking and is relieved with sitting. Pt presents with deficits in MMT, especially proximal hip, balance, gait, HS flexibility, and L hip IR ROM. Pt had mild-mod soft tissue restrictions throughout lumbar paraspinals, glutes, TFL, and quads, but none recreated his same pain. He did report that L hip abd recreated his same pain, however. PT feels pt's c/o pain are likely due to weak gluteal musculature and decreased mm endurance of the glutes and likely not radicular from his lower back as no lumbar ROM or palpation recreated his pain. Pt needs skilled PT intervention to address these impairments in order to reduce pain with standing and walking and maximize return to PLOF.     Clinical Presentation  Stable    Clinical Presentation due to:  see flowsheets for objective tests and measures     Clinical Decision Making  Low    Rehab Potential  Good    PT Frequency  2x / week    PT Duration  4 weeks    PT Treatment/Interventions  ADLs/Self Care Home Management;Aquatic Therapy;Cryotherapy;Electrical Stimulation;Moist Heat;Traction;Ultrasound;Gait training;Stair training;Functional mobility training;Therapeutic activities;Therapeutic exercise;Balance training;Neuromuscular  re-education;Patient/family education;Manual techniques;Passive range of motion;Dry needling;Energy conservation;Taping;Spinal Manipulations;Joint Manipulations    PT Next Visit Plan  review goals; administer FOTO, begin HS stretching, L hip IR ROM work; BLE, functional strengthening; balance work    PT Home Exercise Plan  11/8: bridging, sidelying clams RTB    Consulted and Agree with Plan of Care  Patient       Patient will benefit from skilled therapeutic intervention in order to improve the following deficits and impairments:  Decreased activity tolerance, Decreased balance, Decreased endurance, Decreased range of motion, Decreased strength, Difficulty walking, Hypomobility, Impaired flexibility, Improper body mechanics, Postural dysfunction, Pain  Visit Diagnosis: Pain in left hip - Plan: PT plan of care cert/re-cert  Muscle weakness (generalized) - Plan: PT plan of care cert/re-cert  Difficulty in walking, not elsewhere classified - Plan: PT plan of care cert/re-cert     Problem List Patient Active Problem List   Diagnosis Date Noted  . Difficulty in walking(719.7) 03/26/2012  . Elevated bilirubin 03/25/2012  . Shortness of breath 05/16/2011  . Undiagnosed cardiac murmurs 05/16/2011  . Dizziness and giddiness 05/16/2011  . Mixed hyperlipidemia 05/16/2011  . Coronary atherosclerosis of native coronary artery 05/16/2011        Geraldine Solar PT, DPT   [place] [address] [place], [place], [address] Phone: [phone]   Fax:  [phone]  Name: PHENIX VANDERMEULEN MRN: [id number] Date of Birth: November 01, 1932

## 2018-07-28 ENCOUNTER — Ambulatory Visit (HOSPITAL_COMMUNITY): Payer: Medicare HMO

## 2018-07-28 ENCOUNTER — Encounter (HOSPITAL_COMMUNITY): Payer: Self-pay

## 2018-07-28 DIAGNOSIS — M6281 Muscle weakness (generalized): Secondary | ICD-10-CM | POA: Diagnosis not present

## 2018-07-28 DIAGNOSIS — R262 Difficulty in walking, not elsewhere classified: Secondary | ICD-10-CM

## 2018-07-28 DIAGNOSIS — M25552 Pain in left hip: Secondary | ICD-10-CM | POA: Diagnosis not present

## 2018-07-28 NOTE — Therapy (Signed)
[place], [place], [address] Phone: [phone]   Fax:  [phone]  Physical Therapy Treatment  Patient Details  Name: Jaime [name] MRN: [id number] Date of Birth: 1933/02/26 Referring Provider (PT): Arther Abbott, MD   Encounter Date: 07/28/2018  PT End of Session - 07/28/18 1259    Visit Number  2    Number of Visits  9    Date for PT Re-Evaluation  08/22/18    Authorization Type  [employer] Medicare HMO    Authorization Time Period  07/25/18 to 08/22/18    Authorization - Visit Number  2    Authorization - Number of Visits  10    PT Start Time  8101    PT Stop Time  1341    PT Time Calculation (min)  43 min    Activity Tolerance  Patient tolerated treatment well    Behavior During Therapy  [facility] for tasks assessed/performed       Past Medical History:  Diagnosis Date  . BPH (benign prostatic hyperplasia)   . Coronary atherosclerosis of native coronary artery    Minimal coronary atherosclerosis at catheterization 2003  . Diverticulosis of colon   . Exertional dyspnea   . History of colonic polyps   . History of exercise stress test 05-31-2011   dr Lattie Haw   adequate but abnormal graded exercise test revealing reasonably good exercise capacity, no chest discomfort or other ischemic symptoms, borderline hypertensive blood pressure response and an ecg response consistent with myocardial ishcemia , but with very rapid resolution after the cessation of exercise suggesting a possible false positive ecg response   . History of kidney stones   . Left ureteral stone   . Mixed hyperlipidemia   . Nephrolithiasis   . Osteoarthritis   . Wears glasses   . Wears hearing aid in both ears   . Wears partial dentures    upper only    Past Surgical History:  Procedure Laterality Date  . CARDIAC CATHETERIZATION  02-27-2002  dr hochrein   minimal coronary plaquing , normal lvf  . CYSTOSCOPY W/ URETERAL STENT PLACEMENT  Left 09/23/2017   Procedure: CYSTOSCOPY WITH RETROGRADE PYELOGRAM/URETERAL LEFT STENT PLACEMENT;  Surgeon: Ceasar Mons, MD;  Location: [facility];  Service: Urology;  Laterality: Left;  . CYSTOSCOPY/URETEROSCOPY/HOLMIUM LASER/STENT PLACEMENT Left 10/02/2017   Procedure: CYSTOSCOPY/URETEROSCOPY/HOLMIUM LASER/STENT PLACEMENT, STONE BASKETRY;  Surgeon: Ceasar Mons, MD;  Location: [facility];  Service: Urology;  Laterality: Left;  ONLY NEEDS 45 MIN FOR PROCEDURE  . EXTRACORPOREAL SHOCK WAVE LITHOTRIPSY  2002  . TRANSTHORACIC ECHOCARDIOGRAM  05/31/2011   dr s. Domenic Polite   mild concentric LVH, ef 60-65%/  moderately calcified AV w/ mild AV stenosis, no regurg. (mean grandient 11mmHg, peak gradient 72mmHg)/ trivial TR    There were no vitals filed for this visit.  Subjective Assessment - 07/28/18 1300    Subjective  Pt states that his hip feels good today. He reports compliance with his HEP thus far.     Limitations  Walking    How long can you sit comfortably?  no issues    How long can you stand comfortably?  5-28mins    How long can you walk comfortably?  15-30 mins, varies    Patient Stated Goals  reduce pain    Currently in Pain?  No/denies         [facility] PT Assessment - 07/28/18 0001      Observation/Other  Assessments   Focus on Therapeutic Outcomes (FOTO)   41% limitation          [facility] Adult PT Treatment/Exercise - 07/28/18 0001      Exercises   Exercises  Knee/Hip      Knee/Hip Exercises: Stretches   Passive Hamstring Stretch  Left;3 reps;30 seconds    Passive Hamstring Stretch Limitations  supine with rope    Other Knee/Hip Stretches  LTRs for hip IR/ER 10x5" holds each      Knee/Hip Exercises: Standing   Heel Raises  Both;20 reps    Heel Raises Limitations  heel and toe slope    Hip Abduction  Both;2 sets;15 reps    Abduction Limitations  RTB    Hip Extension  Both;2 sets;20 reps    Extension Limitations  RTB    Forward Step Up   Both;15 reps;Hand Hold: 1;Step Height: 6"    Functional Squat  20 reps    Functional Squat Limitations  chair behind for form    Other Standing Knee Exercises  sidestepping RTB 23ft x2RT      Knee/Hip Exercises: Supine   Bridges  Both;20 reps    Straight Leg Raises  Both;20 reps      Knee/Hip Exercises: Sidelying   Hip ABduction  Left;20 reps             PT Education - 07/28/18 1259    Education Details  reviewed goals, exercise technique    Person(s) Educated  Patient    Methods  Explanation;Demonstration    Comprehension  Verbalized understanding;Returned demonstration       PT Short Term Goals - 07/25/18 1611      PT SHORT TERM GOAL #1   Title  Pt will have improved glute max/hip ext MMT to 4/5 in order to demo improved strength.    Time  2    Period  Weeks    Status  New    Target Date  08/08/18      PT SHORT TERM GOAL #2   Title  Pt will report being able to perform yard work for 1 hour without increases in L hip pain to demo improved gluteal mm endurance and tolerance to functional tasks.    Time  4    Period  Weeks    Status  New      PT SHORT TERM GOAL #3   Title  Pt will be able to perform bil SLS for 6 sec in order to demo improved core and hip strength and to maximize gait.    Time  2    Period  Weeks    Status  New        PT Long Term Goals - 07/25/18 1611      PT LONG TERM GOAL #1   Title  Pt will have improved proximal hip mm to 4+/5 or better (no reports of pain with L hip abd) and 5/5 bil ankle strength in order to maximize standing and walking tolerance.    Time  4    Period  Weeks    Status  New    Target Date  08/22/18      PT LONG TERM GOAL #2   Title  Pt will be able to perform bil SLS for 12 sec or > in order to maximize gait on uneven ground and further demo improved core and hip strength.     Time  4    Period  Weeks    Status  New      PT LONG TERM GOAL #3   Title  Pt will be able to stand for 30 mins or > without increases  in L hip pain to demo improved hip strength and endurance.     Time  4    Period  Weeks    Status  New      PT LONG TERM GOAL #4   Title  Pt will report being able to perform yard work for 2 hours or > without increases in L hip pain to further demo improved gluteal endurance and tolerance to WB, functional tasks.    Time  4    Period  Weeks    Status  New            Plan - 07/28/18 1342    Clinical Impression Statement  Began session by reviewing goals and administering FOTO with no f/u questions afterwards. Focused on hip mobility and hip strengthening this date. Min cues for proper technique. No pain reported during session but pt requiring a couple seated rest breaks due to cardiovascular fatigue. Continue as planned, progressing as able.     Rehab Potential  Good    PT Frequency  2x / week    PT Duration  4 weeks    PT Treatment/Interventions  ADLs/Self Care Home Management;Aquatic Therapy;Cryotherapy;Electrical Stimulation;Moist Heat;Traction;Ultrasound;Gait training;Stair training;Functional mobility training;Therapeutic activities;Therapeutic exercise;Balance training;Neuromuscular re-education;Patient/family education;Manual techniques;Passive range of motion;Dry needling;Energy conservation;Taping;Spinal Manipulations;Joint Manipulations    PT Next Visit Plan  contniue HS stretching, L hip IR ROM work; BLE, functional strengthening; begin balance work, begin leg press for hip strengthening    PT Home Exercise Plan  11/8: bridging, sidelying clams RTB    Consulted and Agree with Plan of Care  Patient       Patient will benefit from skilled therapeutic intervention in order to improve the following deficits and impairments:  Decreased activity tolerance, Decreased balance, Decreased endurance, Decreased range of motion, Decreased strength, Difficulty walking, Hypomobility, Impaired flexibility, Improper body mechanics, Postural dysfunction, Pain  Visit Diagnosis: Pain in left  hip  Muscle weakness (generalized)  Difficulty in walking, not elsewhere classified     Problem List Patient Active Problem List   Diagnosis Date Noted  . Difficulty in walking(719.7) 03/26/2012  . Elevated bilirubin 03/25/2012  . Shortness of breath 05/16/2011  . Undiagnosed cardiac murmurs 05/16/2011  . Dizziness and giddiness 05/16/2011  . Mixed hyperlipidemia 05/16/2011  . Coronary atherosclerosis of native coronary artery 05/16/2011        Geraldine Solar PT, DPT  [place] [address] [place], [place], [address] Phone: [phone]   Fax:  [phone]  Name: HORACIO WERTH MRN: [id number] Date of Birth: 1933/08/19

## 2018-07-30 ENCOUNTER — Encounter

## 2018-07-31 ENCOUNTER — Ambulatory Visit (HOSPITAL_COMMUNITY): Payer: Medicare HMO | Admitting: Physical Therapy

## 2018-07-31 DIAGNOSIS — R262 Difficulty in walking, not elsewhere classified: Secondary | ICD-10-CM

## 2018-07-31 DIAGNOSIS — M25552 Pain in left hip: Secondary | ICD-10-CM | POA: Diagnosis not present

## 2018-07-31 DIAGNOSIS — M6281 Muscle weakness (generalized): Secondary | ICD-10-CM | POA: Diagnosis not present

## 2018-07-31 NOTE — Patient Instructions (Signed)
Tandem Stance    Right foot in front of left, heel touching toe both feet "straight ahead". Stand on Foot Triangle of Support with both feet. Balance in this position _30__ seconds. Do with left foot in front of right.  Single Leg Balance: Eyes Open    Stand on right leg with eyes open. Hold _30__ seconds. _2__ reps _2__ times per day.   Piriformis Stretch, Sitting    Sit, one ankle on opposite knee, same-side hand on crossed knee. Push down on knee, keeping spine straight. Lean torso forward, with flat back, until tension is felt in hamstrings and gluteals of crossed-leg side. Hold __20_ seconds. Repeat _2__ times per session. Do _2__ sessions per day.

## 2018-07-31 NOTE — Therapy (Signed)
[place], [place], [address] Phone: [phone]   Fax:  [phone]  Physical Therapy Treatment  Patient Details  Name: Jaime [name] MRN: [id number] Date of Birth: 06-22-1933 Referring Provider (PT): Arther Abbott, MD   Encounter Date: 07/31/2018  PT End of Session - 07/31/18 0924    Visit Number  3    Number of Visits  9    Date for PT Re-Evaluation  08/22/18    Authorization Type  [employer] H[employer]    Authorization Time Period  07/25/18 to 08/22/18    Authorization - Visit Number  3    Authorization - Number of Visits  10    PT Start Time  0818    PT Stop Time  0902    PT Time Calculation (min)  44 min    Activity Tolerance  Patient tolerated treatment well    Behavior During Therapy  [facility] for tasks assessed/performed       Past Medical History:  Diagnosis Date  . BPH (benign prostatic hyperplasia)   . Coronary atherosclerosis of native coronary artery    Minimal coronary atherosclerosis at catheterization 2003  . Diverticulosis of colon   . Exertional dyspnea   . History of colonic polyps   . History of exercise stress test 05-31-2011   dr Lattie Haw   adequate but abnormal graded exercise test revealing reasonably good exercise capacity, no chest discomfort or other ischemic symptoms, borderline hypertensive blood pressure response and an ecg response consistent with myocardial ishcemia , but with very rapid resolution after the cessation of exercise suggesting a possible false positive ecg response   . History of kidney stones   . Left ureteral stone   . Mixed hyperlipidemia   . Nephrolithiasis   . Osteoarthritis   . Wears glasses   . Wears hearing aid in both ears   . Wears partial dentures    upper only    Past Surgical History:  Procedure Laterality Date  . CARDIAC CATHETERIZATION  02-27-2002  dr hochrein   minimal coronary plaquing , normal lvf  . CYSTOSCOPY W/ URETERAL STENT PLACEMENT  Left 09/23/2017   Procedure: CYSTOSCOPY WITH RETROGRADE PYELOGRAM/URETERAL LEFT STENT PLACEMENT;  Surgeon: Ceasar Mons, MD;  Location: [facility];  Service: Urology;  Laterality: Left;  . CYSTOSCOPY/URETEROSCOPY/HOLMIUM LASER/STENT PLACEMENT Left 10/02/2017   Procedure: CYSTOSCOPY/URETEROSCOPY/HOLMIUM LASER/STENT PLACEMENT, STONE BASKETRY;  Surgeon: Ceasar Mons, MD;  Location: [facility];  Service: Urology;  Laterality: Left;  ONLY NEEDS 45 MIN FOR PROCEDURE  . EXTRACORPOREAL SHOCK WAVE LITHOTRIPSY  2002  . TRANSTHORACIC ECHOCARDIOGRAM  05/31/2011   dr s. Domenic Polite   mild concentric LVH, ef 60-65%/  moderately calcified AV w/ mild AV stenosis, no regurg. (mean grandient 30mmHg, peak gradient 43mmHg)/ trivial TR    There were no vitals filed for this visit.  Subjective Assessment - 07/31/18 0820    Subjective  PT states hes not hurting this morning but was hurting yesterday in his Lt hip and down into his foot.     Currently in Pain?  No/denies                       [facility] Adult PT Treatment/Exercise - 07/31/18 0001      Exercises   Exercises  Knee/Hip      Knee/Hip Exercises: Stretches   Passive Hamstring Stretch  Left;3 reps;30 seconds    Passive Hamstring Stretch Limitations  supine with  rope    Piriformis Stretch  Both;2 reps;20 seconds;Limitations    Piriformis Stretch Limitations  seated    Other Knee/Hip Stretches  LTRs for hip IR/ER 10x5" holds each      Knee/Hip Exercises: Machines for Strengthening   Total Gym Leg Press  3 PL 15 reps      Knee/Hip Exercises: Standing   Heel Raises  Both;20 reps    Heel Raises Limitations  heel and toe slope    Hip Abduction  Both;2 sets;15 reps    Abduction Limitations  RTB    Hip Extension  Both;2 sets;20 reps    Extension Limitations  RTB    Forward Step Up  Both;15 reps;Hand Hold: 1;Step Height: 6"    Functional Squat  20 reps    Functional Squat Limitations  chair behind for form     Other Standing Knee Exercises  hip hike Lt 15 reps using 2"      Knee/Hip Exercises: Supine   Bridges  Both;20 reps    Straight Leg Raises  Both;20 reps      Knee/Hip Exercises: Sidelying   Hip ABduction  Left;20 reps          Balance Exercises - 07/31/18 0917      Balance Exercises: Standing   Standing Eyes Opened  --    Tandem Stance  Eyes open;Intermittent upper extremity support;2 reps;30 secs    SLS  5 reps;Limitations   max Rt: 17", lt: 10"         PT Short Term Goals - 07/25/18 1611      PT SHORT TERM GOAL #1   Title  Pt will have improved glute max/hip ext MMT to 4/5 in order to demo improved strength.    Time  2    Period  Weeks    Status  New    Target Date  08/08/18      PT SHORT TERM GOAL #2   Title  Pt will report being able to perform yard work for 1 hour without increases in L hip pain to demo improved gluteal mm endurance and tolerance to functional tasks.    Time  4    Period  Weeks    Status  New      PT SHORT TERM GOAL #3   Title  Pt will be able to perform bil SLS for 6 sec in order to demo improved core and hip strength and to maximize gait.    Time  2    Period  Weeks    Status  New        PT Long Term Goals - 07/25/18 1611      PT LONG TERM GOAL #1   Title  Pt will have improved proximal hip mm to 4+/5 or better (no reports of pain with L hip abd) and 5/5 bil ankle strength in order to maximize standing and walking tolerance.    Time  4    Period  Weeks    Status  New    Target Date  08/22/18      PT LONG TERM GOAL #2   Title  Pt will be able to perform bil SLS for 12 sec or > in order to maximize gait on uneven ground and further demo improved core and hip strength.     Time  4    Period  Weeks    Status  New      PT LONG TERM GOAL #3   Title  Pt will be able to stand for 30 mins or > without increases in L hip pain to demo improved hip strength and endurance.     Time  4    Period  Weeks    Status  New      PT LONG  TERM GOAL #4   Title  Pt will report being able to perform yard work for 2 hours or > without increases in L hip pain to further demo improved gluteal endurance and tolerance to WB, functional tasks.    Time  4    Period  Weeks    Status  New            Plan - 07/31/18 0926    Clinical Impression Statement  continued with established exercises. began static balance activities with noted weakness in Lt glute med mm.  Added hip hikes to help strengthen this.  Tightness also noted in piriformis mm bilaterally, Lt>Rt and added this to POC as well.  Pt requires manual and verbal cues throughout session and HOH.  No rest breaks required this session.    Rehab Potential  Good    PT Frequency  2x / week    PT Duration  4 weeks    PT Treatment/Interventions  ADLs/Self Care Home Management;Aquatic Therapy;Cryotherapy;Electrical Stimulation;Moist Heat;Traction;Ultrasound;Gait training;Stair training;Functional mobility training;Therapeutic activities;Therapeutic exercise;Balance training;Neuromuscular re-education;Patient/family education;Manual techniques;Passive range of motion;Dry needling;Energy conservation;Taping;Spinal Manipulations;Joint Manipulations    PT Next Visit Plan  contniue HS stretching, L hip IR ROM work; BLE, functional strengthening.  Progress balance work.  Next session begin vector stance to improve SLS time/glute strength.      PT Home Exercise Plan  11/8: bridging, sidelying clams RTB  11/14: piriformis stretch seated, SLS, tandem stance    Consulted and Agree with Plan of Care  Patient       Patient will benefit from skilled therapeutic intervention in order to improve the following deficits and impairments:  Decreased activity tolerance, Decreased balance, Decreased endurance, Decreased range of motion, Decreased strength, Difficulty walking, Hypomobility, Impaired flexibility, Improper body mechanics, Postural dysfunction, Pain  Visit Diagnosis: Pain in left hip  Muscle  weakness (generalized)  Difficulty in walking, not elsewhere classified     Problem List Patient Active Problem List   Diagnosis Date Noted  . Difficulty in walking(719.7) 03/26/2012  . Elevated bilirubin 03/25/2012  . Shortness of breath 05/16/2011  . Undiagnosed cardiac murmurs 05/16/2011  . Dizziness and giddiness 05/16/2011  . Mixed hyperlipidemia 05/16/2011  . Coronary atherosclerosis of native coronary artery 05/16/2011   Teena Irani, PTA/CLT [phone]  Teena Irani 07/31/2018, 9:39 AM  [facility] [address] [place], [place], [address] Phone: [phone]   Fax:  [phone]  Name: Jaime [name] MRN: [id number] Date of Birth: 03-17-1933

## 2018-08-05 ENCOUNTER — Encounter (HOSPITAL_COMMUNITY): Payer: Self-pay

## 2018-08-05 ENCOUNTER — Ambulatory Visit (HOSPITAL_COMMUNITY): Payer: Medicare HMO

## 2018-08-05 DIAGNOSIS — M6281 Muscle weakness (generalized): Secondary | ICD-10-CM

## 2018-08-05 DIAGNOSIS — M25552 Pain in left hip: Secondary | ICD-10-CM | POA: Diagnosis not present

## 2018-08-05 DIAGNOSIS — R262 Difficulty in walking, not elsewhere classified: Secondary | ICD-10-CM

## 2018-08-05 NOTE — Therapy (Signed)
[place], [place], [address] Phone: [phone]   Fax:  [phone]  Physical Therapy Treatment  Patient Details  Name: Jaime [name] MRN: [id number] Date of Birth: February 18, 1933 Referring Provider (PT): Arther Abbott, MD   Encounter Date: 08/05/2018  PT End of Session - 08/05/18 0943    Visit Number  4    Number of Visits  9    Date for PT Re-Evaluation  08/22/18    Authorization Type  [employer] HMO    Authorization Time Period  07/25/18 to 08/22/18    Authorization - Visit Number  4    Authorization - Number of Visits  10    PT Start Time  0948    PT Stop Time  1030    PT Time Calculation (min)  42 min    Activity Tolerance  Patient tolerated treatment well    Behavior During Therapy  [facility] for tasks assessed/performed       Past Medical History:  Diagnosis Date  . BPH (benign prostatic hyperplasia)   . Coronary atherosclerosis of native coronary artery    Minimal coronary atherosclerosis at catheterization 2003  . Diverticulosis of colon   . Exertional dyspnea   . History of colonic polyps   . History of exercise stress test 05-31-2011   dr Lattie Haw   adequate but abnormal graded exercise test revealing reasonably good exercise capacity, no chest discomfort or other ischemic symptoms, borderline hypertensive blood pressure response and an ecg response consistent with myocardial ishcemia , but with very rapid resolution after the cessation of exercise suggesting a possible false positive ecg response   . History of kidney stones   . Left ureteral stone   . Mixed hyperlipidemia   . Nephrolithiasis   . Osteoarthritis   . Wears glasses   . Wears hearing aid in both ears   . Wears partial dentures    upper only    Past Surgical History:  Procedure Laterality Date  . CARDIAC CATHETERIZATION  02-27-2002  dr hochrein   minimal coronary plaquing , normal lvf  . CYSTOSCOPY W/ URETERAL STENT PLACEMENT  Left 09/23/2017   Procedure: CYSTOSCOPY WITH RETROGRADE PYELOGRAM/URETERAL LEFT STENT PLACEMENT;  Surgeon: Ceasar Mons, MD;  Location: [facility];  Service: Urology;  Laterality: Left;  . CYSTOSCOPY/URETEROSCOPY/HOLMIUM LASER/STENT PLACEMENT Left 10/02/2017   Procedure: CYSTOSCOPY/URETEROSCOPY/HOLMIUM LASER/STENT PLACEMENT, STONE BASKETRY;  Surgeon: Ceasar Mons, MD;  Location: [facility];  Service: Urology;  Laterality: Left;  ONLY NEEDS 45 MIN FOR PROCEDURE  . EXTRACORPOREAL SHOCK WAVE LITHOTRIPSY  2002  . TRANSTHORACIC ECHOCARDIOGRAM  05/31/2011   dr s. Domenic Polite   mild concentric LVH, ef 60-65%/  moderately calcified AV w/ mild AV stenosis, no regurg. (mean grandient 96mmHg, peak gradient 36mmHg)/ trivial TR    There were no vitals filed for this visit.  Subjective Assessment - 08/05/18 0943    Subjective  No pain to speak of this morning. Has been performing his HEP.     Currently in Pain?  No/denies                       [facility] Adult PT Treatment/Exercise - 08/05/18 0001      Exercises   Exercises  Knee/Hip      Knee/Hip Exercises: Stretches   Active Hamstring Stretch  Both;2 reps;30 seconds    Active Hamstring Stretch Limitations  12" step    Piriformis Stretch  Both;2 reps;30  seconds;Limitations    Piriformis Stretch Limitations  seated    Other Knee/Hip Stretches  LTRs for hip IR/ER 10x5" holds each      Knee/Hip Exercises: Machines for Strengthening   Total Gym Leg Press  3 PL 20 reps      Knee/Hip Exercises: Standing   Heel Raises  Both;20 reps    Heel Raises Limitations  heel and toe slope    Hip Abduction  Both;2 sets;15 reps    Abduction Limitations  RTB    Hip Extension  Both;2 sets;20 reps    Extension Limitations  RTB    Forward Step Up  Both;15 reps;Hand Hold: 1;Step Height: 6"    Functional Squat  20 reps    Functional Squat Limitations  chair behind for form    Other Standing Knee Exercises  sidestepping RTB  64ft x2RT      Knee/Hip Exercises: Supine   Bridges  Both;20 reps    Bridges Limitations  3 sec hold      Knee/Hip Exercises: Sidelying   Hip ABduction  Strengthening;Both;1 set;20 reps             PT Education - 08/05/18 0952    Education Details  Discussed purpose and technique throughout session.    Person(s) Educated  Patient    Methods  Explanation    Comprehension  Verbalized understanding       PT Short Term Goals - 08/05/18 1038      PT SHORT TERM GOAL #1   Title  Pt will have improved glute max/hip ext MMT to 4/5 in order to demo improved strength.    Time  2    Period  Weeks    Status  On-going      PT SHORT TERM GOAL #2   Title  Pt will report being able to perform yard work for 1 hour without increases in L hip pain to demo improved gluteal mm endurance and tolerance to functional tasks.    Time  4    Period  Weeks    Status  On-going      PT SHORT TERM GOAL #3   Title  Pt will be able to perform bil SLS for 6 sec in order to demo improved core and hip strength and to maximize gait.    Time  2    Period  Weeks    Status  On-going        PT Long Term Goals - 08/05/18 1038      PT LONG TERM GOAL #1   Title  Pt will have improved proximal hip mm to 4+/5 or better (no reports of pain with L hip abd) and 5/5 bil ankle strength in order to maximize standing and walking tolerance.    Time  4    Period  Weeks    Status  On-going      PT LONG TERM GOAL #2   Title  Pt will be able to perform bil SLS for 12 sec or > in order to maximize gait on uneven ground and further demo improved core and hip strength.     Time  4    Period  Weeks    Status  On-going      PT LONG TERM GOAL #3   Title  Pt will be able to stand for 30 mins or > without increases in L hip pain to demo improved hip strength and endurance.     Time  4  Period  Weeks    Status  On-going      PT LONG TERM GOAL #4   Title  Pt will report being able to perform yard work for 2 hours  or > without increases in L hip pain to further demo improved gluteal endurance and tolerance to WB, functional tasks.    Time  4    Period  Weeks    Status  On-going            Plan - 08/05/18 0943    Clinical Impression Statement  Continued with established plan of care. Tightness also noted in piriformis and hamstring mm bilaterally, Lt>Rt. Session focused on bilateral LE strengthening, especially glut and quad. No complaints of pain noted during or after session. Instructed patient to hold HEP stretches for a full 30 seconds and to spend more time stretching left LE when able. Continue with current plan, progress as able.     Rehab Potential  Good    PT Frequency  2x / week    PT Duration  4 weeks    PT Treatment/Interventions  ADLs/Self Care Home Management;Aquatic Therapy;Cryotherapy;Electrical Stimulation;Moist Heat;Traction;Ultrasound;Gait training;Stair training;Functional mobility training;Therapeutic activities;Therapeutic exercise;Balance training;Neuromuscular re-education;Patient/family education;Manual techniques;Passive range of motion;Dry needling;Energy conservation;Taping;Spinal Manipulations;Joint Manipulations    PT Next Visit Plan  contniue HS/piriformis stretching, L hip IR ROM work; BLE, functional strengthening.  Progress balance work.  Next session begin vector stance to improve SLS time/glute strength.      PT Home Exercise Plan  11/8: bridging, sidelying clams RTB  11/14: piriformis stretch seated, SLS, tandem stance    Consulted and Agree with Plan of Care  Patient       Patient will benefit from skilled therapeutic intervention in order to improve the following deficits and impairments:  Decreased activity tolerance, Decreased balance, Decreased endurance, Decreased range of motion, Decreased strength, Difficulty walking, Hypomobility, Impaired flexibility, Improper body mechanics, Postural dysfunction, Pain  Visit Diagnosis: Pain in left hip  Muscle  weakness (generalized)  Difficulty in walking, not elsewhere classified     Problem List Patient Active Problem List   Diagnosis Date Noted  . Difficulty in walking(719.7) 03/26/2012  . Elevated bilirubin 03/25/2012  . Shortness of breath 05/16/2011  . Undiagnosed cardiac murmurs 05/16/2011  . Dizziness and giddiness 05/16/2011  . Mixed hyperlipidemia 05/16/2011  . Coronary atherosclerosis of native coronary artery 05/16/2011    Floria Raveling. Hartnett-Rands, MS, PT [facility] #[id number] 08/05/2018, 10:39 AM  [place], [place], [address] Phone: [phone]   Fax:  [phone]  Name: LEAMON PALAU MRN: [id number] Date of Birth: 12/21/1932

## 2018-08-07 ENCOUNTER — Ambulatory Visit (HOSPITAL_COMMUNITY): Payer: Medicare HMO | Admitting: Physical Therapy

## 2018-08-07 ENCOUNTER — Encounter (HOSPITAL_COMMUNITY): Payer: Self-pay | Admitting: Physical Therapy

## 2018-08-07 DIAGNOSIS — R262 Difficulty in walking, not elsewhere classified: Secondary | ICD-10-CM

## 2018-08-07 DIAGNOSIS — M6281 Muscle weakness (generalized): Secondary | ICD-10-CM | POA: Diagnosis not present

## 2018-08-07 DIAGNOSIS — M25552 Pain in left hip: Secondary | ICD-10-CM

## 2018-08-07 NOTE — Therapy (Signed)
[place], [place], [address] Phone: [phone]   Fax:  [phone]  Physical Therapy Treatment  Patient Details  Name: Jaime [name] MRN: [id number] Date of Birth: 1933-05-24 Referring Provider (PT): Arther Abbott, MD   Encounter Date: 08/07/2018  PT End of Session - 08/07/18 0838    Visit Number  5    Number of Visits  9    Date for PT Re-Evaluation  08/22/18    Authorization Type  [employer] H[employer]    Authorization Time Period  07/25/18 to 08/22/18    Authorization - Visit Number  5    Authorization - Number of Visits  10    PT Start Time  0815    PT Stop Time  1324    PT Time Calculation (min)  39 min    Activity Tolerance  Patient tolerated treatment well    Behavior During Therapy  [facility] for tasks assessed/performed       Past Medical History:  Diagnosis Date  . BPH (benign prostatic hyperplasia)   . Coronary atherosclerosis of native coronary artery    Minimal coronary atherosclerosis at catheterization 2003  . Diverticulosis of colon   . Exertional dyspnea   . History of colonic polyps   . History of exercise stress test 05-31-2011   dr Lattie Haw   adequate but abnormal graded exercise test revealing reasonably good exercise capacity, no chest discomfort or other ischemic symptoms, borderline hypertensive blood pressure response and an ecg response consistent with myocardial ishcemia , but with very rapid resolution after the cessation of exercise suggesting a possible false positive ecg response   . History of kidney stones   . Left ureteral stone   . Mixed hyperlipidemia   . Nephrolithiasis   . Osteoarthritis   . Wears glasses   . Wears hearing aid in both ears   . Wears partial dentures    upper only    Past Surgical History:  Procedure Laterality Date  . CARDIAC CATHETERIZATION  02-27-2002  dr hochrein   minimal coronary plaquing , normal lvf  . CYSTOSCOPY W/ URETERAL STENT PLACEMENT  Left 09/23/2017   Procedure: CYSTOSCOPY WITH RETROGRADE PYELOGRAM/URETERAL LEFT STENT PLACEMENT;  Surgeon: Ceasar Mons, MD;  Location: [facility];  Service: Urology;  Laterality: Left;  . CYSTOSCOPY/URETEROSCOPY/HOLMIUM LASER/STENT PLACEMENT Left 10/02/2017   Procedure: CYSTOSCOPY/URETEROSCOPY/HOLMIUM LASER/STENT PLACEMENT, STONE BASKETRY;  Surgeon: Ceasar Mons, MD;  Location: [facility];  Service: Urology;  Laterality: Left;  ONLY NEEDS 45 MIN FOR PROCEDURE  . EXTRACORPOREAL SHOCK WAVE LITHOTRIPSY  2002  . TRANSTHORACIC ECHOCARDIOGRAM  05/31/2011   dr s. Domenic Polite   mild concentric LVH, ef 60-65%/  moderately calcified AV w/ mild AV stenosis, no regurg. (mean grandient 64mmHg, peak gradient 37mmHg)/ trivial TR    There were no vitals filed for this visit.  Subjective Assessment - 08/07/18 0817    Subjective  Patient denied any medical changes or any pain this morning.     Currently in Pain?  No/denies                       [facility] Adult PT Treatment/Exercise - 08/07/18 0001      Exercises   Exercises  Knee/Hip      Knee/Hip Exercises: Stretches   Active Hamstring Stretch  Both;2 reps;30 seconds    Active Hamstring Stretch Limitations  12" step    Piriformis Stretch  Both;2 reps;30 seconds;Limitations  Piriformis Stretch Limitations  seated    Other Knee/Hip Stretches  LTRs for hip IR/ER 10x10" holds each      Knee/Hip Exercises: Machines for Strengthening   Total Gym Leg Press  3 PL 20 reps      Knee/Hip Exercises: Standing   Heel Raises  Both;20 reps    Heel Raises Limitations  heel and toe slope    Hip Abduction  Both;2 sets;15 reps    Abduction Limitations  RTB    Hip Extension  Both;2 sets;20 reps    Extension Limitations  RTB    Forward Step Up  Both;15 reps;Hand Hold: 1;Step Height: 6"    Functional Squat  20 reps    Functional Squat Limitations  chair behind for form    Other Standing Knee Exercises  Vector stance SLS  10 x 5'' holds forward, side, back. With max verbal cues and demonstration      Knee/Hip Exercises: Supine   Bridges  Both;20 reps    Bridges Limitations  3 sec hold      Knee/Hip Exercises: Sidelying   Hip ABduction  Strengthening;1 set;20 reps;Left             PT Education - 08/07/18 0903    Education Details  Discussed purpose and technique of exercises throughout session.     Person(s) Educated  Patient    Methods  Explanation;Demonstration;Verbal cues;Tactile cues    Comprehension  Verbalized understanding       PT Short Term Goals - 08/05/18 1038      PT SHORT TERM GOAL #1   Title  Pt will have improved glute max/hip ext MMT to 4/5 in order to demo improved strength.    Time  2    Period  Weeks    Status  On-going      PT SHORT TERM GOAL #2   Title  Pt will report being able to perform yard work for 1 hour without increases in L hip pain to demo improved gluteal mm endurance and tolerance to functional tasks.    Time  4    Period  Weeks    Status  On-going      PT SHORT TERM GOAL #3   Title  Pt will be able to perform bil SLS for 6 sec in order to demo improved core and hip strength and to maximize gait.    Time  2    Period  Weeks    Status  On-going        PT Long Term Goals - 08/05/18 1038      PT LONG TERM GOAL #1   Title  Pt will have improved proximal hip mm to 4+/5 or better (no reports of pain with L hip abd) and 5/5 bil ankle strength in order to maximize standing and walking tolerance.    Time  4    Period  Weeks    Status  On-going      PT LONG TERM GOAL #2   Title  Pt will be able to perform bil SLS for 12 sec or > in order to maximize gait on uneven ground and further demo improved core and hip strength.     Time  4    Period  Weeks    Status  On-going      PT LONG TERM GOAL #3   Title  Pt will be able to stand for 30 mins or > without increases in L hip pain to demo improved  hip strength and endurance.     Time  4    Period  Weeks     Status  On-going      PT LONG TERM GOAL #4   Title  Pt will report being able to perform yard work for 2 hours or > without increases in L hip pain to further demo improved gluteal endurance and tolerance to WB, functional tasks.    Time  4    Period  Weeks    Status  On-going            Plan - 08/07/18 0762    Clinical Impression Statement  This session continued with established plan of care. Continued progressing patient this session by introducing vector stance exercises this session. Patient required frequent cues and demonstration throughout session. Patient tolerated all exercises well and denied pain throughout session. Patient would benefit from continued skilled physical therapy in order to continue progressing towards functional goals.     Rehab Potential  Good    PT Frequency  2x / week    PT Duration  4 weeks    PT Treatment/Interventions  ADLs/Self Care Home Management;Aquatic Therapy;Cryotherapy;Electrical Stimulation;Moist Heat;Traction;Ultrasound;Gait training;Stair training;Functional mobility training;Therapeutic activities;Therapeutic exercise;Balance training;Neuromuscular re-education;Patient/family education;Manual techniques;Passive range of motion;Dry needling;Energy conservation;Taping;Spinal Manipulations;Joint Manipulations    PT Next Visit Plan  contniue HS/piriformis stretching, L hip IR ROM work; BLE, functional strengthening.  Progress balance work.  Next session begin vector stance to improve SLS time/glute strength.      PT Home Exercise Plan  11/8: bridging, sidelying clams RTB  11/14: piriformis stretch seated, SLS, tandem stance    Consulted and Agree with Plan of Care  Patient       Patient will benefit from skilled therapeutic intervention in order to improve the following deficits and impairments:  Decreased activity tolerance, Decreased balance, Decreased endurance, Decreased range of motion, Decreased strength, Difficulty walking, Hypomobility,  Impaired flexibility, Improper body mechanics, Postural dysfunction, Pain  Visit Diagnosis: Pain in left hip  Muscle weakness (generalized)  Difficulty in walking, not elsewhere classified     Problem List Patient Active Problem List   Diagnosis Date Noted  . Difficulty in walking(719.7) 03/26/2012  . Elevated bilirubin 03/25/2012  . Shortness of breath 05/16/2011  . Undiagnosed cardiac murmurs 05/16/2011  . Dizziness and giddiness 05/16/2011  . Mixed hyperlipidemia 05/16/2011  . Coronary atherosclerosis of native coronary artery 05/16/2011   Clarene Critchley PT, DPT 9:04 AM, 08/07/18 [place], [place], [address] Phone: [phone]   Fax:  [phone]  Name: Jaime [name] MRN: [id number] Date of Birth: 1933/05/20

## 2018-08-12 ENCOUNTER — Ambulatory Visit (HOSPITAL_COMMUNITY): Payer: Medicare HMO | Admitting: Physical Therapy

## 2018-08-12 DIAGNOSIS — R262 Difficulty in walking, not elsewhere classified: Secondary | ICD-10-CM | POA: Diagnosis not present

## 2018-08-12 DIAGNOSIS — M6281 Muscle weakness (generalized): Secondary | ICD-10-CM | POA: Diagnosis not present

## 2018-08-12 DIAGNOSIS — M25552 Pain in left hip: Secondary | ICD-10-CM

## 2018-08-12 NOTE — Therapy (Signed)
[place], [place], [address] Phone: [phone]   Fax:  [phone]  Physical Therapy Treatment  Patient Details  Name: Jaime [name] MRN: [id number] Date of Birth: [birth year] Referring Provider (PT): Arther Abbott, MD   Encounter Date: 08/12/2018  PT End of Session - 08/12/18 0928    Visit Number  6    Number of Visits  9    Date for PT Re-Evaluation  08/22/18    Authorization Type  [employer] HMO    Authorization Time Period  07/25/18 to 08/22/18    Authorization - Visit Number  6    Authorization - Number of Visits  10    PT Start Time  0905    PT Stop Time  0948    PT Time Calculation (min)  43 min    Activity Tolerance  Patient tolerated treatment well    Behavior During Therapy  [facility] for tasks assessed/performed       Past Medical History:  Diagnosis Date  . BPH (benign prostatic hyperplasia)   . Coronary atherosclerosis of native coronary artery    Minimal coronary atherosclerosis at catheterization 2003  . Diverticulosis of colon   . Exertional dyspnea   . History of colonic polyps   . History of exercise stress test 05-31-2011   dr Lattie Haw   adequate but abnormal graded exercise test revealing reasonably good exercise capacity, no chest discomfort or other ischemic symptoms, borderline hypertensive blood pressure response and an ecg response consistent with myocardial ishcemia , but with very rapid resolution after the cessation of exercise suggesting a possible false positive ecg response   . History of kidney stones   . Left ureteral stone   . Mixed hyperlipidemia   . Nephrolithiasis   . Osteoarthritis   . Wears glasses   . Wears hearing aid in both ears   . Wears partial dentures    upper only    Past Surgical History:  Procedure Laterality Date  . CARDIAC CATHETERIZATION  02-27-2002  dr hochrein   minimal coronary plaquing , normal lvf  . CYSTOSCOPY W/ URETERAL STENT PLACEMENT  Left 09/23/2017   Procedure: CYSTOSCOPY WITH RETROGRADE PYELOGRAM/URETERAL LEFT STENT PLACEMENT;  Surgeon: Ceasar Mons, MD;  Location: [facility];  Service: Urology;  Laterality: Left;  . CYSTOSCOPY/URETEROSCOPY/HOLMIUM LASER/STENT PLACEMENT Left 10/02/2017   Procedure: CYSTOSCOPY/URETEROSCOPY/HOLMIUM LASER/STENT PLACEMENT, STONE BASKETRY;  Surgeon: Ceasar Mons, MD;  Location: [facility];  Service: Urology;  Laterality: Left;  ONLY NEEDS 45 MIN FOR PROCEDURE  . EXTRACORPOREAL SHOCK WAVE LITHOTRIPSY  2002  . TRANSTHORACIC ECHOCARDIOGRAM  05/31/2011   dr s. Domenic Polite   mild concentric LVH, ef 60-65%/  moderately calcified AV w/ mild AV stenosis, no regurg. (mean grandient 8mmHg, peak gradient 61mmHg)/ trivial TR    There were no vitals filed for this visit.  Subjective Assessment - 08/12/18 0910    Subjective  patient states he is not hurting this morning.  Reports no issues.     Currently in Pain?  No/denies                       [facility] Adult PT Treatment/Exercise - 08/12/18 0001      Exercises   Exercises  Knee/Hip      Knee/Hip Exercises: Stretches   Active Hamstring Stretch  Both;2 reps;30 seconds    Active Hamstring Stretch Limitations  12" step    Piriformis Stretch  Both;30 seconds;Limitations;1  rep    Piriformis Stretch Limitations  seated      Knee/Hip Exercises: Machines for Strengthening   Total Gym Leg Press  3 PL 20 reps      Knee/Hip Exercises: Standing   Heel Raises  Both;20 reps    Heel Raises Limitations  heel and toe slope    Hip Abduction  Both;2 sets;15 reps    Abduction Limitations  RTB    Hip Extension  Both;2 sets;20 reps    Extension Limitations  RTB    Forward Step Up  Both;Hand Hold: 1;Step Height: 6";20 reps    Functional Squat  20 reps    Functional Squat Limitations  chair behind for form    Other Standing Knee Exercises  sidestepping RTB 37ft x2RT      Knee/Hip Exercises: Supine   Bridges   Both;20 reps    Bridges Limitations  3 sec hold      Knee/Hip Exercises: Sidelying   Hip ABduction  Strengthening;1 set;20 reps;Left    Hip ABduction Limitations  with manual assist for maintaining form               PT Short Term Goals - 08/05/18 1038      PT SHORT TERM GOAL #1   Title  Pt will have improved glute max/hip ext MMT to 4/5 in order to demo improved strength.    Time  2    Period  Weeks    Status  On-going      PT SHORT TERM GOAL #2   Title  Pt will report being able to perform yard work for 1 hour without increases in L hip pain to demo improved gluteal mm endurance and tolerance to functional tasks.    Time  4    Period  Weeks    Status  On-going      PT SHORT TERM GOAL #3   Title  Pt will be able to perform bil SLS for 6 sec in order to demo improved core and hip strength and to maximize gait.    Time  2    Period  Weeks    Status  On-going        PT Long Term Goals - 08/05/18 1038      PT LONG TERM GOAL #1   Title  Pt will have improved proximal hip mm to 4+/5 or better (no reports of pain with L hip abd) and 5/5 bil ankle strength in order to maximize standing and walking tolerance.    Time  4    Period  Weeks    Status  On-going      PT LONG TERM GOAL #2   Title  Pt will be able to perform bil SLS for 12 sec or > in order to maximize gait on uneven ground and further demo improved core and hip strength.     Time  4    Period  Weeks    Status  On-going      PT LONG TERM GOAL #3   Title  Pt will be able to stand for 30 mins or > without increases in L hip pain to demo improved hip strength and endurance.     Time  4    Period  Weeks    Status  On-going      PT LONG TERM GOAL #4   Title  Pt will report being able to perform yard work for 2 hours or > without increases in L  hip pain to further demo improved gluteal endurance and tolerance to WB, functional tasks.    Time  4    Period  Weeks    Status  On-going            Plan -  08/12/18 1142    Clinical Impression Statement  continued with focus on improving LE strength and stability.  Pt able to complete all without pain or issues, however continued manual and verbal cues for form, utilization of correct muscles with actvities.  Manual assist to keep hips in alignment with side lying hip abduction.  Pt verbalized at end of session he would like to be discharged next visit as he has improved and feels he is ready to be done with therapy.     Rehab Potential  Good    PT Frequency  2x / week    PT Duration  4 weeks    PT Treatment/Interventions  ADLs/Self Care Home Management;Aquatic Therapy;Cryotherapy;Electrical Stimulation;Moist Heat;Traction;Ultrasound;Gait training;Stair training;Functional mobility training;Therapeutic activities;Therapeutic exercise;Balance training;Neuromuscular re-education;Patient/family education;Manual techniques;Passive range of motion;Dry needling;Energy conservation;Taping;Spinal Manipulations;Joint Manipulations    PT Next Visit Plan  Re eval next session per request with probable discharge.     PT Home Exercise Plan  11/8: bridging, sidelying clams RTB  11/14: piriformis stretch seated, SLS, tandem stance    Consulted and Agree with Plan of Care  Patient       Patient will benefit from skilled therapeutic intervention in order to improve the following deficits and impairments:  Decreased activity tolerance, Decreased balance, Decreased endurance, Decreased range of motion, Decreased strength, Difficulty walking, Hypomobility, Impaired flexibility, Improper body mechanics, Postural dysfunction, Pain  Visit Diagnosis: Pain in left hip  Muscle weakness (generalized)  Difficulty in walking, not elsewhere classified     Problem List Patient Active Problem List   Diagnosis Date Noted  . Difficulty in walking(719.7) 03/26/2012  . Elevated bilirubin 03/25/2012  . Shortness of breath 05/16/2011  . Undiagnosed cardiac murmurs 05/16/2011   . Dizziness and giddiness 05/16/2011  . Mixed hyperlipidemia 05/16/2011  . Coronary atherosclerosis of native coronary artery 05/16/2011   Teena Irani, PTA/CLT [id number]  Teena Irani 08/12/2018, 11:45 AM  [place] [address] [place], [place], [address] Phone: [phone]   Fax:  [phone]  Name: KOWEN KLUTH MRN: [id number] Date of Birth: [birth year]

## 2018-08-19 ENCOUNTER — Ambulatory Visit (HOSPITAL_COMMUNITY): Payer: Medicare HMO | Attending: Orthopedic Surgery | Admitting: Physical Therapy

## 2018-08-19 DIAGNOSIS — M6281 Muscle weakness (generalized): Secondary | ICD-10-CM | POA: Insufficient documentation

## 2018-08-19 DIAGNOSIS — R262 Difficulty in walking, not elsewhere classified: Secondary | ICD-10-CM | POA: Diagnosis not present

## 2018-08-19 DIAGNOSIS — M25552 Pain in left hip: Secondary | ICD-10-CM | POA: Insufficient documentation

## 2018-08-19 NOTE — Therapy (Signed)
[facility] [address] [place], [place], [address] Phone: [phone]   Fax:  [phone]   PHYSICAL THERAPY DISCHARGE SUMMARY  Visits from Start of Care: 7  Current functional level related to goals / functional outcomes: See below   Remaining deficits: See below   Education / Equipment: Continue HEP  Plan: Patient agrees to discharge.  Patient goals were met. Patient is being discharged due to meeting the stated rehab goals.  ?????     Pt ready for d/c at this time due to overall progress made. He is safe to d/c to his HEP.  Geraldine Solar PT, DPT  Physical Therapy Treatment  Patient Details  Name: Jaime [name] MRN: [id number] Date of Birth: [birth year] Referring Provider (PT): Arther Abbott, MD   Encounter Date: 08/19/2018  PT End of Session - 08/19/18 0844    Visit Number  7    Number of Visits  9    Date for PT Re-Evaluation  08/22/18    Authorization Type  [employer] H[employer]    Authorization Time Period  07/25/18 to 08/22/18    Authorization - Visit Number  7    Authorization - Number of Visits  10    PT Start Time  0815    PT Stop Time  0845    PT Time Calculation (min)  30 min    Activity Tolerance  Patient tolerated treatment well    Behavior During Therapy  [facility] for tasks assessed/performed       Past Medical History:  Diagnosis Date  . BPH (benign prostatic hyperplasia)   . Coronary atherosclerosis of native coronary artery    Minimal coronary atherosclerosis at catheterization 2003  . Diverticulosis of colon   . Exertional dyspnea   . History of colonic polyps   . History of exercise stress test 05-31-2011   dr Lattie Haw   adequate but abnormal graded exercise test revealing reasonably good exercise capacity, no chest discomfort or other ischemic symptoms, borderline hypertensive blood pressure response and an ecg response consistent with myocardial ishcemia , but with very rapid resolution after the  cessation of exercise suggesting a possible false positive ecg response   . History of kidney stones   . Left ureteral stone   . Mixed hyperlipidemia   . Nephrolithiasis   . Osteoarthritis   . Wears glasses   . Wears hearing aid in both ears   . Wears partial dentures    upper only    Past Surgical History:  Procedure Laterality Date  . CARDIAC CATHETERIZATION  02-27-2002  dr hochrein   minimal coronary plaquing , normal lvf  . CYSTOSCOPY W/ URETERAL STENT PLACEMENT Left 09/23/2017   Procedure: CYSTOSCOPY WITH RETROGRADE PYELOGRAM/URETERAL LEFT STENT PLACEMENT;  Surgeon: Ceasar Mons, MD;  Location: [facility];  Service: Urology;  Laterality: Left;  . CYSTOSCOPY/URETEROSCOPY/HOLMIUM LASER/STENT PLACEMENT Left 10/02/2017   Procedure: CYSTOSCOPY/URETEROSCOPY/HOLMIUM LASER/STENT PLACEMENT, STONE BASKETRY;  Surgeon: Ceasar Mons, MD;  Location: [facility];  Service: Urology;  Laterality: Left;  ONLY NEEDS 45 MIN FOR PROCEDURE  . EXTRACORPOREAL SHOCK WAVE LITHOTRIPSY  2002  . TRANSTHORACIC ECHOCARDIOGRAM  05/31/2011   dr s. Domenic Polite   mild concentric LVH, ef 60-65%/  moderately calcified AV w/ mild AV stenosis, no regurg. (mean grandient 21mHg, peak gradient 164mg)/ trivial TR    There were no vitals filed for this visit.      [facility]  Assessment   Medical Diagnosis  chronic L-sided LBP with L-sided sciatica    Referring Provider (PT)  Stanley Harrison, MD      Observation/Other Assessments   Focus on Therapeutic Outcomes (FOTO)   41% limitation      ROM / Strength   AROM / PROM / Strength  AROM;Strength      AROM   AROM Assessment Site  Lumbar    Lumbar Flexion  WFL    Lumbar Extension  WFL    Lumbar - Right Side Bend  WFL    Lumbar - Left Side Bend  WFL    Lumbar - Right Rotation  WFL    Lumbar - Left Rotation  WFL      Strength   Right Hip Flexion  5/5    Right Hip Extension  5/5   was 3+   Right  Hip ABduction  5/5   was 4   Left Hip Flexion  5/5    Left Hip Extension  5/5   was 3+   Left Hip ABduction  5/5   was 4   Right Knee Flexion  5/5   was 4+   Right Knee Extension  5/5    Left Knee Flexion  5/5   was 4+   Left Knee Extension  5/5    Right Ankle Dorsiflexion  5/5   was 4+   Left Ankle Dorsiflexion  5/5   was 4                          PT Education - 08/19/18 0936    Education Details  encouraged to continue HEP and added vectors to further increase glute stab    Person(s) Educated  Patient    Methods  Explanation;Demonstration    Comprehension  Verbalized understanding;Returned demonstration       PT Short Term Goals - 08/19/18 0829      PT SHORT TERM GOAL #1   Title  Pt will have improved glute max/hip ext MMT to 4/5 in order to demo improved strength.    Time  2    Period  Weeks    Status  Achieved      PT SHORT TERM GOAL #2   Title  Pt will report being able to perform yard work for 1 hour without increases in L hip pain to demo improved gluteal mm endurance and tolerance to functional tasks.    Time  4    Period  Weeks    Status  Achieved      PT SHORT TERM GOAL #3   Title  Pt will be able to perform bil SLS for 6 sec in order to demo improved core and hip strength and to maximize gait.    Time  2    Period  Weeks    Status  Achieved        PT Long Term Goals - 08/19/18 0830      PT LONG TERM GOAL #1   Title  Pt will have improved proximal hip mm to 4+/5 or better (no reports of pain with L hip abd) and 5/5 bil ankle strength in order to maximize standing and walking tolerance.    Time  4    Period  Weeks    Status  Achieved      PT LONG TERM GOAL #2   Title  Pt will be able to perform bil SLS for 12 sec or >   in order to maximize gait on uneven ground and further demo improved core and hip strength.     Time  4    Period  Weeks    Status  Achieved      PT LONG TERM GOAL #3   Title  Pt will be able to stand for 30  mins or > without increases in L hip pain to demo improved hip strength and endurance.     Time  4    Period  Weeks    Status  Achieved      PT LONG TERM GOAL #4   Title  Pt will report being able to perform yard work for 2 hours or > without increases in L hip pain to further demo improved gluteal endurance and tolerance to WB, functional tasks.    Baseline  could do 2-3 hours with small breaks in between    Time  4    Period  Weeks    Status  Partially Met            Plan - 08/19/18 0934    Clinical Impression Statement  Pt verbalized readiness for discharge.  pt now with 5/5 LE stength and much improved balance.  Reviewed goals, meeting all STG's and all but one LTG.  Pt given vector stance to add to HEP and encouraged to continue all other exercises.  Pt verbalized understanding and had no further questions.      Rehab Potential  Good    PT Frequency  2x / week    PT Duration  4 weeks    PT Treatment/Interventions  ADLs/Self Care Home Management;Aquatic Therapy;Cryotherapy;Electrical Stimulation;Moist Heat;Traction;Ultrasound;Gait training;Stair training;Functional mobility training;Therapeutic activities;Therapeutic exercise;Balance training;Neuromuscular re-education;Patient/family education;Manual techniques;Passive range of motion;Dry needling;Energy conservation;Taping;Spinal Manipulations;Joint Manipulations    PT Next Visit Plan  discharge to HEP.    PT Home Exercise Plan  11/8: bridging, sidelying clams RTB  11/14: piriformis stretch seated, SLS, tandem stance  12/3:  vectors    Consulted and Agree with Plan of Care  Patient       Patient will benefit from skilled therapeutic intervention in order to improve the following deficits and impairments:  Decreased activity tolerance, Decreased balance, Decreased endurance, Decreased range of motion, Decreased strength, Difficulty walking, Hypomobility, Impaired flexibility, Improper body mechanics, Postural dysfunction,  Pain  Visit Diagnosis: Pain in left hip  Muscle weakness (generalized)  Difficulty in walking, not elsewhere classified     Problem List Patient Active Problem List   Diagnosis Date Noted  . Difficulty in walking(719.7) 03/26/2012  . Elevated bilirubin 03/25/2012  . Shortness of breath 05/16/2011  . Undiagnosed cardiac murmurs 05/16/2011  . Dizziness and giddiness 05/16/2011  . Mixed hyperlipidemia 05/16/2011  . Coronary atherosclerosis of native coronary artery 05/16/2011   Teena Irani, PTA/CLT [phone]  Teena Irani 08/19/2018, 9:38 AM  [facility] [address] [place], [place], [address] Phone: [phone]   Fax:  [phone]  Name: Jaime [name] MRN: [id number] Date of Birth: [birth year]

## 2018-08-22 ENCOUNTER — Encounter (HOSPITAL_COMMUNITY): Payer: Medicare HMO | Admitting: Physical Therapy

## 2018-08-26 ENCOUNTER — Encounter (HOSPITAL_COMMUNITY): Payer: Medicare HMO | Admitting: Physical Therapy

## 2018-08-28 ENCOUNTER — Encounter (HOSPITAL_COMMUNITY): Payer: Medicare HMO

## 2018-10-15 DIAGNOSIS — R69 Illness, unspecified: Secondary | ICD-10-CM | POA: Diagnosis not present

## 2018-11-17 DIAGNOSIS — R3915 Urgency of urination: Secondary | ICD-10-CM | POA: Diagnosis not present

## 2018-11-17 DIAGNOSIS — N200 Calculus of kidney: Secondary | ICD-10-CM | POA: Diagnosis not present

## 2018-11-17 DIAGNOSIS — R350 Frequency of micturition: Secondary | ICD-10-CM | POA: Diagnosis not present

## 2018-11-17 DIAGNOSIS — N401 Enlarged prostate with lower urinary tract symptoms: Secondary | ICD-10-CM | POA: Diagnosis not present

## 2019-03-25 DIAGNOSIS — H938X3 Other specified disorders of ear, bilateral: Secondary | ICD-10-CM | POA: Diagnosis not present

## 2019-03-25 DIAGNOSIS — H903 Sensorineural hearing loss, bilateral: Secondary | ICD-10-CM | POA: Diagnosis not present

## 2019-03-25 DIAGNOSIS — Z974 Presence of external hearing-aid: Secondary | ICD-10-CM | POA: Diagnosis not present

## 2019-04-30 DIAGNOSIS — R69 Illness, unspecified: Secondary | ICD-10-CM | POA: Diagnosis not present

## 2019-06-15 DIAGNOSIS — Z23 Encounter for immunization: Secondary | ICD-10-CM | POA: Diagnosis not present

## 2019-07-01 DIAGNOSIS — Z13220 Encounter for screening for lipoid disorders: Secondary | ICD-10-CM | POA: Diagnosis not present

## 2019-07-01 DIAGNOSIS — Z1389 Encounter for screening for other disorder: Secondary | ICD-10-CM | POA: Diagnosis not present

## 2019-07-01 DIAGNOSIS — J329 Chronic sinusitis, unspecified: Secondary | ICD-10-CM | POA: Diagnosis not present

## 2019-07-01 DIAGNOSIS — Z6825 Body mass index (BMI) 25.0-25.9, adult: Secondary | ICD-10-CM | POA: Diagnosis not present

## 2019-07-01 DIAGNOSIS — Z79899 Other long term (current) drug therapy: Secondary | ICD-10-CM | POA: Diagnosis not present

## 2019-07-01 DIAGNOSIS — I10 Essential (primary) hypertension: Secondary | ICD-10-CM | POA: Diagnosis not present

## 2019-07-01 DIAGNOSIS — N400 Enlarged prostate without lower urinary tract symptoms: Secondary | ICD-10-CM | POA: Diagnosis not present

## 2019-07-01 DIAGNOSIS — R634 Abnormal weight loss: Secondary | ICD-10-CM | POA: Diagnosis not present

## 2019-07-03 DIAGNOSIS — E0590 Thyrotoxicosis, unspecified without thyrotoxic crisis or storm: Secondary | ICD-10-CM | POA: Diagnosis not present

## 2019-07-07 IMAGING — CT CT RENAL STONE PROTOCOL
2 of 4 series · 15 of 46 positions shown, 17 images · non-contrast
Comparison: 10/16/2010

CLINICAL DATA: Pt reports sharp constant left lower abdominal pain
that radiates around to the left flank today, pain on and off.
Associated with nausea and vomiting. No diarrhea or fevers.

EXAM:
CT ABDOMEN AND PELVIS WITHOUT CONTRAST
TECHNIQUE: Multidetector CT imaging of the abdomen and pelvis was performed
following the standard protocol without IV contrast.

[Series 2: axial st · axial · 0.82mm/px · z∈[+1037,+1517]mm · 12 of 106 slices shown, 14 images]
[im 5/106  soft-tissue]
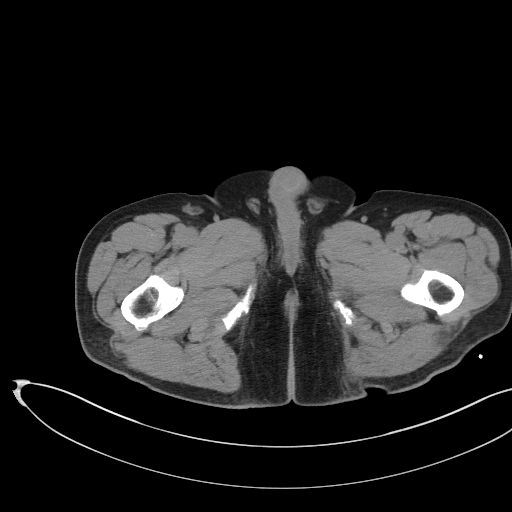
[im 5/106  bone]
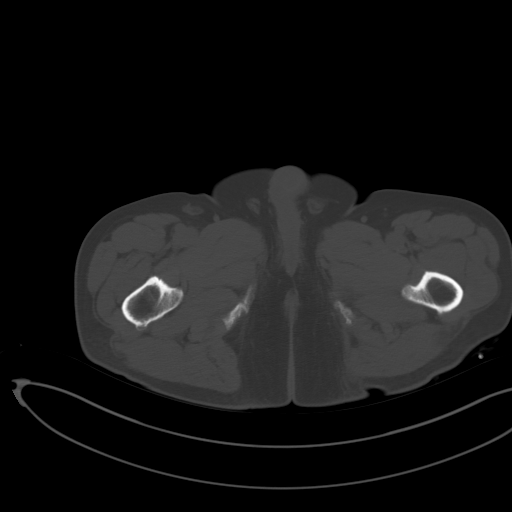
[im 15/106  soft-tissue]
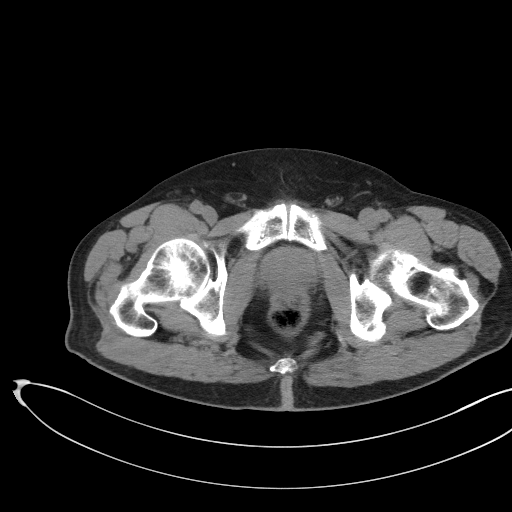
[im 24/106  soft-tissue]
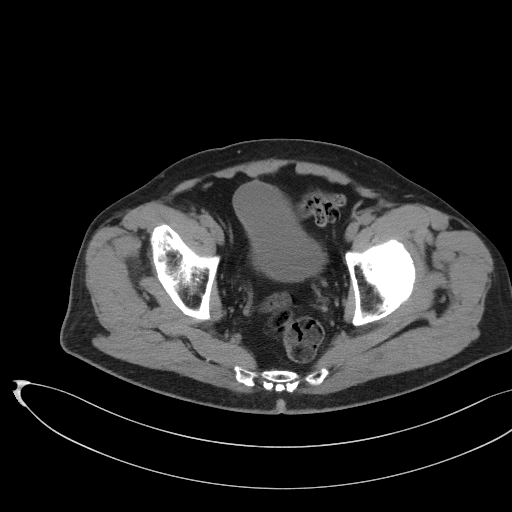
[im 34/106  soft-tissue]
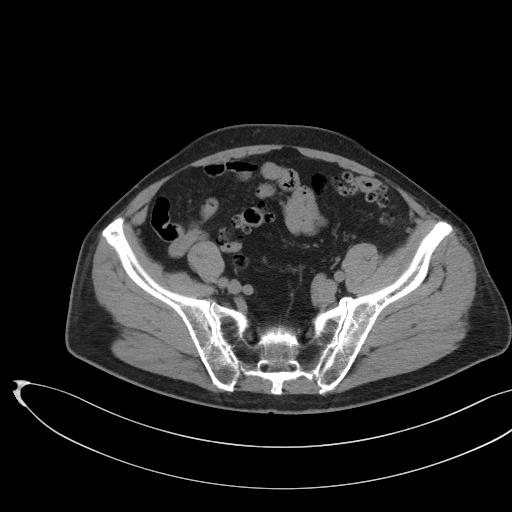
[im 39/106  soft-tissue]
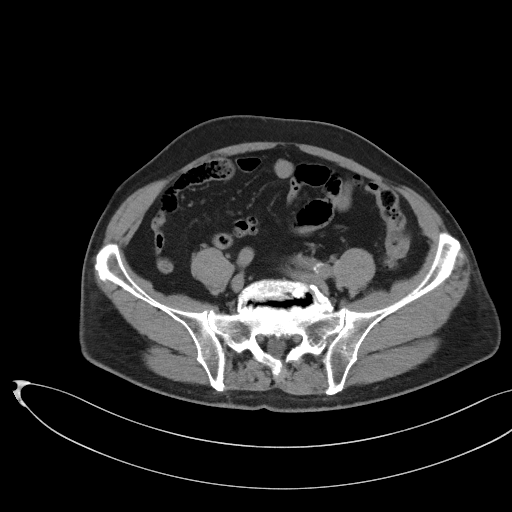
[im 48/106  soft-tissue]
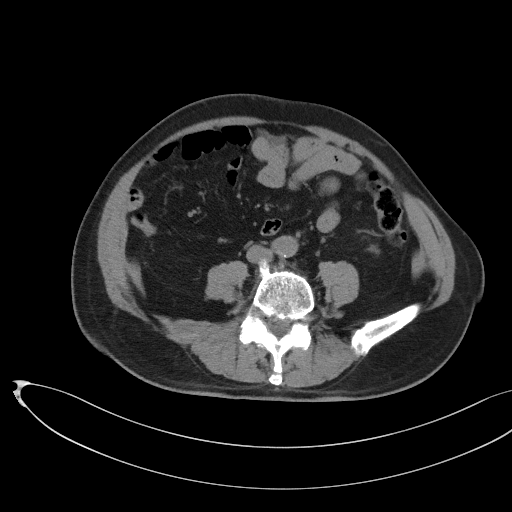
[im 58/106  soft-tissue]
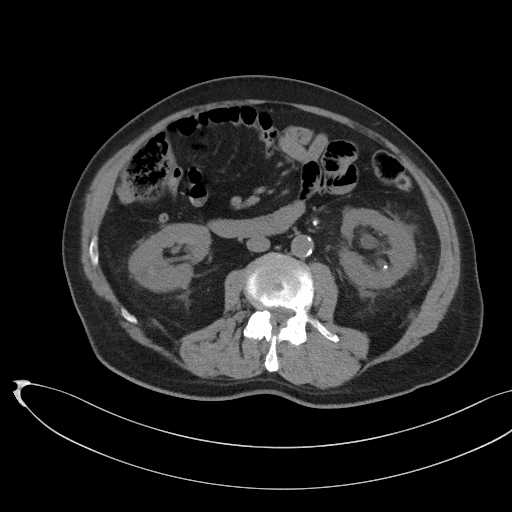
[im 67/106  soft-tissue]
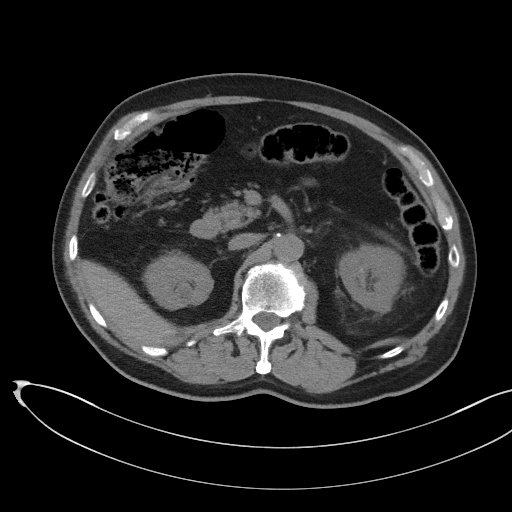
[im 72/106  soft-tissue]
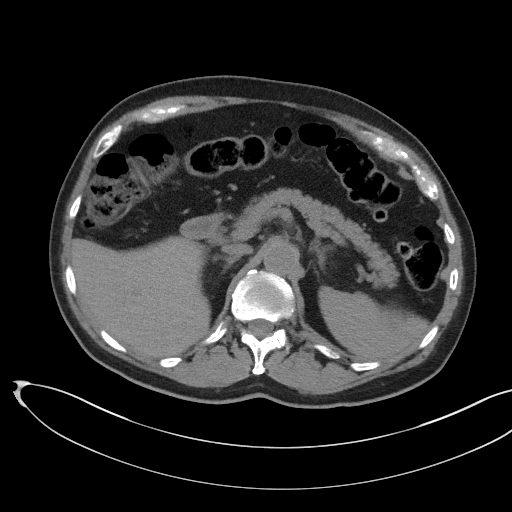
[im 72/106  bone]
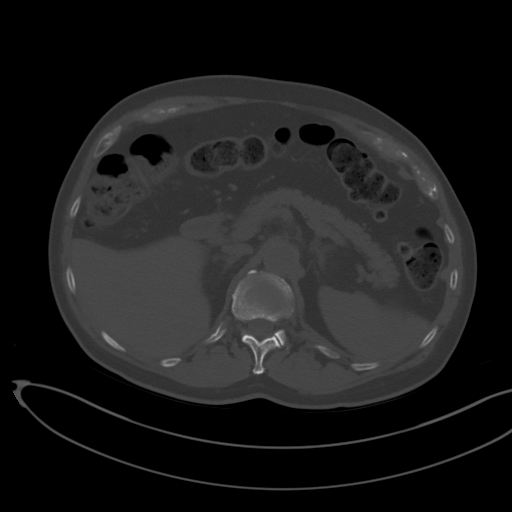
[im 82/106  soft-tissue]
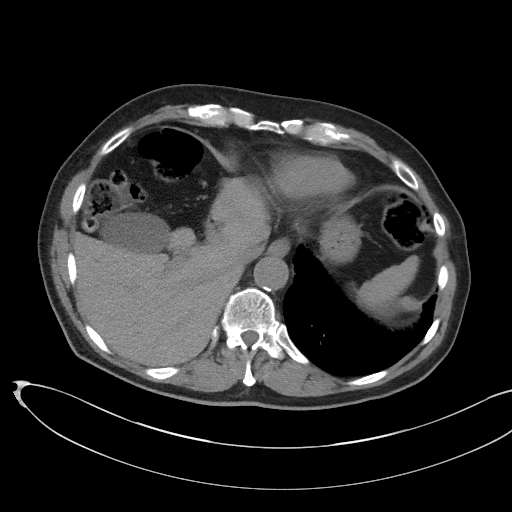
[im 91/106  soft-tissue]
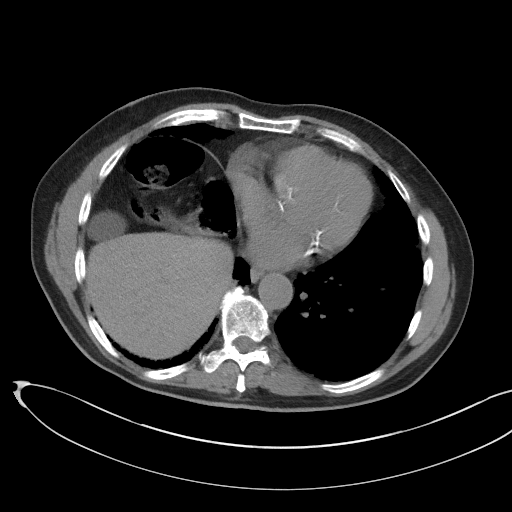
[im 101/106  soft-tissue]
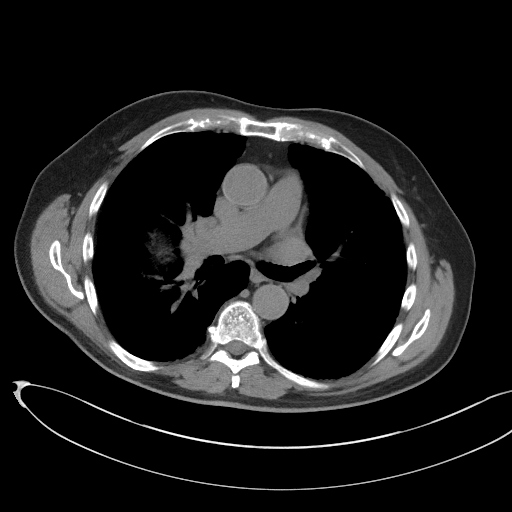

[Series 5: coronal st · coronal · 0.88mm/px · 3 of 101 slices shown]
[im 34/101  soft-tissue]
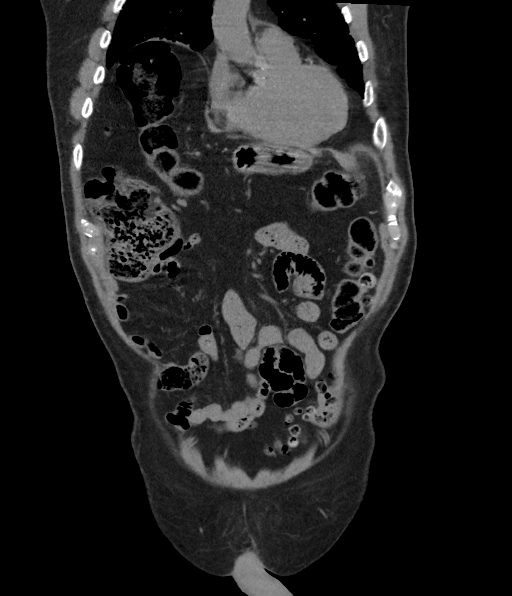
[im 45/101  soft-tissue]
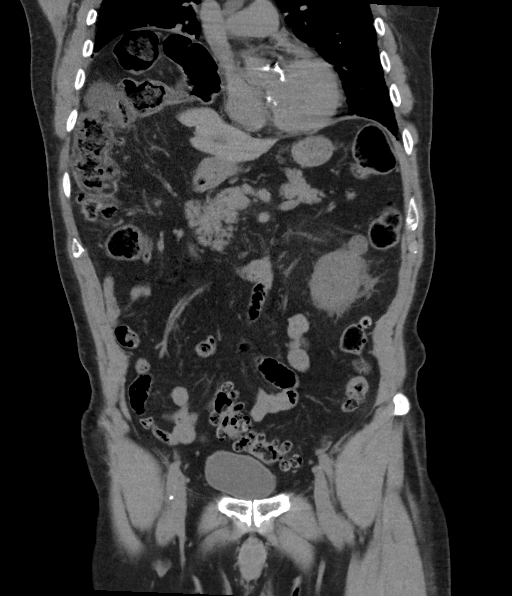
[im 56/101  soft-tissue]
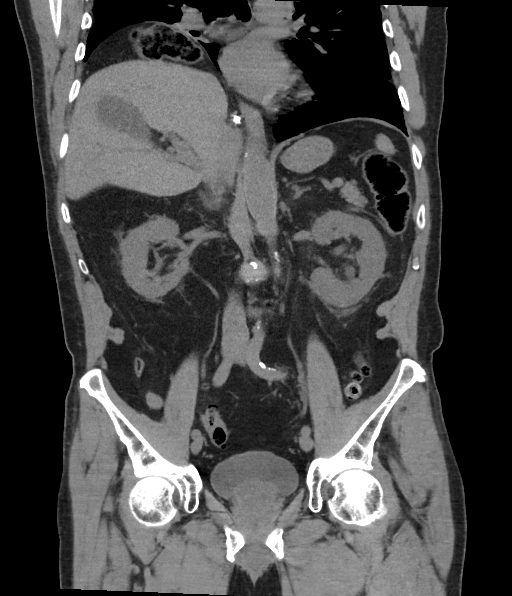

[15 of 46 positions shown; findings below may reference images not displayed]

FINDINGS: Lower chest: There is significant atherosclerotic calcification of
the coronary arteries. Calcification of the mitral annulus. There is
elevation of the right hemidiaphragm.

Hepatobiliary: No focal liver abnormality is seen. No radiopaque
gallstones, biliary dilatation, or pericholecystic inflammatory
changes.

Pancreas: Unremarkable. No pancreatic ductal dilatation or
surrounding inflammatory changes.

Spleen: Normal in size without focal abnormality.

Adrenals/Urinary Tract: Adrenal glands are normal in appearance.

There is left-sided hydronephrosis secondary to a calculus in the
proximal left ureter which measures 6 mm at the level of L3. There
is associated perinephric stranding on the left. Intrarenal calculus
in the lower pole the left kidney is 3 mm. A 2 mm calculus is
identified in the upper pole the right kidney. There are numerous
low-attenuation lesions within the kidneys bilaterally. By density
measurements, these are compatible with benign cysts. No suspicious
renal lesions are identified. The urinary bladder, visualized
urethra, and distal ureters are unremarkable.

Stomach/Bowel: The stomach and small bowel loops are unremarkable.
The appendix is well seen and has a normal appearance. There is
extensive colonic diverticulosis, particularly involving the sigmoid
colon. No associated diverticulitis.

Vascular/Lymphatic: There is atherosclerotic calcification of the
abdominal aorta not associated with aneurysm. No retroperitoneal or
mesenteric adenopathy.

Reproductive:  There is enlargement of the prostate.

Other: No free pelvic fluid. Anterior abdominal wall is
unremarkable.

Musculoskeletal: Degenerative changes are seen in the lumbar spine.
Marked disc height loss particularly noted at L3-4, L4-5, and L5-S1.
Disc protrusion noted at the same levels.
IMPRESSION: 1. Left-sided hydronephrosis and perinephric stranding secondary to
a 6 mm calculus in the proximal left ureter at the level of L3.
2. Numerous additional small bilateral intrarenal calculi.
3. Numerous small bilateral renal cysts.
4. Normal appendix.
5. Significant colonic diverticulosis without acute diverticulitis.
6.  Aortic atherosclerosis.  ([id number])
7. Coronary artery disease.
8. Prostatic enlargement.
9. Degenerative disc disease of the lumbar spine.

## 2019-07-15 ENCOUNTER — Other Ambulatory Visit: Payer: Self-pay

## 2019-07-17 ENCOUNTER — Ambulatory Visit: Payer: Medicare HMO | Admitting: Endocrinology

## 2019-07-17 ENCOUNTER — Other Ambulatory Visit: Payer: Self-pay

## 2019-07-17 ENCOUNTER — Ambulatory Visit (INDEPENDENT_AMBULATORY_CARE_PROVIDER_SITE_OTHER): Payer: Medicare HMO | Admitting: Endocrinology

## 2019-07-17 ENCOUNTER — Encounter: Payer: Self-pay | Admitting: Endocrinology

## 2019-07-17 DIAGNOSIS — E0590 Thyrotoxicosis, unspecified without thyrotoxic crisis or storm: Secondary | ICD-10-CM | POA: Diagnosis not present

## 2019-07-17 MED ORDER — METHIMAZOLE 5 MG PO TABS: 2.5000 mg | ORAL_TABLET | Freq: Every day | ORAL | 5 refills | Status: DC

## 2019-07-17 NOTE — Patient Instructions (Addendum)
Your blood pressure is high today.  Please follow this up with Dr Gerarda Fraction.   I have sent a prescription to your pharmacy, to slow the thyroid.   If ever you have fever while taking methimazole, stop it and call us, even if the reason is obvious, because of the risk of a rare side-effect.   Please come back for a follow-up appointment in 4-6 weeks.     Hyperthyroidism  Hyperthyroidism is when the thyroid gland is too active (overactive). The thyroid gland is a small gland located in the lower front part of the neck, just in front of the windpipe (trachea). This gland makes hormones that help control how the body uses food for energy (metabolism) as well as how the heart and brain function. These hormones also play a role in keeping your bones strong. When the thyroid is overactive, it produces too much of a hormone called thyroxine. What are the causes? This condition may be caused by:  Graves' disease. This is a disorder in which the body's disease-fighting system (immune system) attacks the thyroid gland. This is the most common cause.  Inflammation of the thyroid gland.  A tumor in the thyroid gland.  Use of certain medicines, including: ? Prescription thyroid hormone replacement. ? Herbal supplements that mimic thyroid hormones. ? Amiodarone therapy.  Solid or fluid-filled lumps within your thyroid gland (thyroid nodules).  Taking in a large amount of iodine from foods or medicines. What increases the risk? You are more likely to develop this condition if:  You are male.  You have a family history of thyroid conditions.  You smoke tobacco.  You use a medicine called lithium.  You take medicines that affect the immune system (immunosuppressants). What are the signs or symptoms? Symptoms of this condition include:  Nervousness.  Inability to tolerate heat.  Unexplained weight loss.  Diarrhea.  Change in the texture of hair or skin.  Heart skipping beats or making  extra beats.  Rapid heart rate.  Loss of menstruation.  Shaky hands.  Fatigue.  Restlessness.  Sleep problems.  Enlarged thyroid gland or a lump in the thyroid (nodule). You may also have symptoms of Graves' disease, which may include:  Protruding eyes.  Dry eyes.  Red or swollen eyes.  Problems with vision. How is this diagnosed? This condition may be diagnosed based on:  Your symptoms and medical history.  A physical exam.  Blood tests.  Thyroid ultrasound. This test involves using sound waves to produce images of the thyroid gland.  A thyroid scan. A radioactive substance is injected into a vein, and images show how much iodine is present in the thyroid.  Radioactive iodine uptake test (RAIU). A small amount of radioactive iodine is given by mouth to see how much iodine the thyroid absorbs after a certain amount of time. How is this treated? Treatment depends on the cause and severity of the condition. Treatment may include:  Medicines to reduce the amount of thyroid hormone your body makes.  Radioactive iodine treatment (radioiodine therapy). This involves swallowing a small dose of radioactive iodine, in capsule or liquid form, to kill thyroid cells.  Surgery to remove part or all of your thyroid gland. You may need to take thyroid hormone replacement medicine for the rest of your life after thyroid surgery.  Medicines to help manage your symptoms. Follow these instructions at home:   Take over-the-counter and prescription medicines only as told by your health care provider.  Do not use any  products that contain nicotine or tobacco, such as cigarettes and e-cigarettes. If you need help quitting, ask your health care provider.  Follow any instructions from your health care provider about diet. You may be instructed to limit foods that contain iodine.  Keep all follow-up visits as told by your health care provider. This is important. ? You will need to  have blood tests regularly so that your health care provider can monitor your condition. Contact a health care provider if:  Your symptoms do not get better with treatment.  You have a fever.  You are taking thyroid hormone replacement medicine and you: ? Have symptoms of depression. ? Feel like you are tired all the time. ? Gain weight. Get help right away if:  You have chest pain.  You have decreased alertness or a change in your awareness.  You have abdominal pain.  You feel dizzy.  You have a rapid heartbeat.  You have an irregular heartbeat.  You have difficulty breathing. Summary  The thyroid gland is a small gland located in the lower front part of the neck, just in front of the windpipe (trachea).  Hyperthyroidism is when the thyroid gland is too active (overactive) and produces too much of a hormone called thyroxine.  The most common cause is Graves' disease, a disorder in which your immune system attacks the thyroid gland.  Hyperthyroidism can cause various symptoms, such as unexplained weight loss, nervousness, inability to tolerate heat, or changes in your heartbeat.  Treatment may include medicine to reduce the amount of thyroid hormone your body makes, radioiodine therapy, surgery, or medicines to manage symptoms. This information is not intended to replace advice given to you by your health care provider. Make sure you discuss any questions you have with your health care provider. Document Released: 09/03/2005 Document Revised: 08/16/2017 Document Reviewed: 08/14/2017 Elsevier Patient Education  2020 [employer].

## 2019-07-17 NOTE — Progress Notes (Signed)
Subjective:    Patient ID: Jaime [name], male    DOB: 1932/10/09, 85 y.o.   MRN: [id number]  HPI Pt is referred by Dr Gerarda Fraction, for hyperthyroidism.  he was dx'ed with hyperthyroidism a few weeks ago.  He has never been on therapy for this.  He has never had XRT to the anterior neck, or thyroid surgery.  He has never had thyroid imaging.  He does not consume kelp or any other non-prescribed thyroid medication.  He has never been on amiodarone.  He has slight tremor of the hands, and assoc weight loss.   Past Medical History:  Diagnosis Date  . BPH (benign prostatic hyperplasia)   . Coronary atherosclerosis of native coronary artery    Minimal coronary atherosclerosis at catheterization 2003  . Diverticulosis of colon   . Exertional dyspnea   . History of colonic polyps   . History of exercise stress test 05-31-2011   dr Lattie Haw   adequate but abnormal graded exercise test revealing reasonably good exercise capacity, no chest discomfort or other ischemic symptoms, borderline hypertensive blood pressure response and an ecg response consistent with myocardial ishcemia , but with very rapid resolution after the cessation of exercise suggesting a possible false positive ecg response   . History of kidney stones   . Left ureteral stone   . Mixed hyperlipidemia   . Nephrolithiasis   . Osteoarthritis   . Wears glasses   . Wears hearing aid in both ears   . Wears partial dentures    upper only    Past Surgical History:  Procedure Laterality Date  . CARDIAC CATHETERIZATION  02-27-2002  dr hochrein   minimal coronary plaquing , normal lvf  . CYSTOSCOPY W/ URETERAL STENT PLACEMENT Left 09/23/2017   Procedure: CYSTOSCOPY WITH RETROGRADE PYELOGRAM/URETERAL LEFT STENT PLACEMENT;  Surgeon: Ceasar Mons, MD;  Location: [facility];  Service: Urology;  Laterality: Left;  . CYSTOSCOPY/URETEROSCOPY/HOLMIUM LASER/STENT PLACEMENT Left 10/02/2017   Procedure: CYSTOSCOPY/URETEROSCOPY/HOLMIUM  LASER/STENT PLACEMENT, STONE BASKETRY;  Surgeon: Ceasar Mons, MD;  Location: [facility];  Service: Urology;  Laterality: Left;  ONLY NEEDS 45 MIN FOR PROCEDURE  . EXTRACORPOREAL SHOCK WAVE LITHOTRIPSY  2002  . TRANSTHORACIC ECHOCARDIOGRAM  05/31/2011   dr s. Domenic Polite   mild concentric LVH, ef 60-65%/  moderately calcified AV w/ mild AV stenosis, no regurg. (mean grandient 65mmHg, peak gradient 13mmHg)/ trivial TR    Social History   Socioeconomic History  . Marital status: Married    Spouse name: Riyadh Mazanec  . Number of children: 3  . Years of education: Not on file  . Highest education level: Not on file  Occupational History  . 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 Financial resource strain: Not on file  . Food insecurity    Worry: Not on file    Inability: Not on file  . Transportation needs    Medical: Not on file    Non-medical: Not on file  Tobacco Use  . Smoking status: Never Smoker  . Smokeless tobacco: Current User    Types: Chew  . Tobacco comment: chew tobacco for 30 years (as of 09-30-2017)  Substance and Sexual Activity  . Alcohol use: No  . Drug use: No  . Sexual activity: Not on file  Lifestyle  . Physical activity    Days per week: Not on file    Minutes per session: Not on file  .  Stress: Not on file  Relationships  . Social Herbalist on phone: Not on file    Gets together: Not on file    Attends religious service: Not on file    Active member of club or organization: Not on file    Attends meetings of clubs or organizations: Not on file    Relationship status: Not on file  . Intimate partner violence    Fear of current or ex partner: Not on file    Emotionally abused: Not on file    Physically abused: Not on file    Forced sexual activity: Not on file  Other Topics Concern  . Not on file  Social History Narrative  . Not on file    Current Outpatient  Medications on File Prior to Visit  Medication Sig Dispense Refill  . amLODipine (NORVASC) 5 MG tablet Take 5 mg by mouth daily.  1  . amoxicillin-clavulanate (AUGMENTIN) 875-125 MG tablet Take 1 tablet by mouth every 12 (twelve) hours.    Marland Kitchen aspirin EC 81 MG tablet Take 81 mg by mouth daily.    Marland Kitchen atorvastatin (LIPITOR) 40 MG tablet Take 40 mg by mouth daily.  3  . multivitamin (ONE-A-DAY MEN'S) TABS tablet Take 1 tablet by mouth daily.    . tamsulosin (FLOMAX) 0.4 MG CAPS capsule Take 0.4 mg by mouth daily.  3   No current facility-administered medications on file prior to visit.     No Known Allergies  Family History  Problem Relation Age of Onset  . Cancer Father        Bone cancer  . Thyroid disease Neg Hx     BP (!) 158/88 (BP Location: Right Arm, Patient Position: Sitting, Cuff Size: Normal)   Pulse 100   Ht 5\' 8"  (1.727 m)   Wt 159 lb 9.6 oz (72.4 kg)   SpO2 94%   BMI 24.27 kg/m   Review of Systems denies fever, headache, hoarseness, diplopia, palpitations, sob, diarrhea, muscle weakness, edema, anxiety, heat intolerance, and rhinorrhea.  He has easy bruising, frequent urination, and diaphoresis.     Objective:   Physical Exam VS: see vs page GEN: no distress HEAD: head: no deformity eyes: no periorbital swelling, no proptosis external nose and ears are normal EARS: bilat hearing aids NECK: supple, thyroid is slightly enlarged, with multinodular surface CHEST WALL: no deformity LUNGS: clear to auscultation CV: reg rate and rhythm, no murmur ABD: abdomen is soft, nontender.  no hepatosplenomegaly.  not distended.  no hernia MUSCULOSKELETAL: muscle bulk and strength are grossly normal.  no obvious joint swelling.  gait is normal and steady EXTEMITIES: no deformity.  no edema PULSES: no carotid bruit NEURO:  cn 2-12 grossly intact, except for hearing loss.   readily moves all 4's.  sensation is intact to touch on all 4's.  Fine tremor of the hands. SKIN:  Normal  texture and temperature.  No rash or suspicious lesion is visible.  Few ecchymoses of the forearms.  Not diaphoretic NODES:  None palpable at the neck PSYCH: alert, well-oriented.  Does not appear anxious nor depressed.   outside test results are reviewed: TSH=0.13  I have reviewed outside records, and summarized: Pt was noted to have abnormal TFT, and referred here.  Other probs addressed were BPH, sinusitis, depression and HTN     Assessment & Plan:  Hyperthyroidism, new, mild.  Usually due to MNG.  CAD: in this setting, he should maintain euthyroidism.  HTN: is  noted today.   Patient Instructions  Your blood pressure is high today.  Please follow this up with Dr Gerarda Fraction.   I have sent a prescription to your pharmacy, to slow the thyroid.   If ever you have fever while taking methimazole, stop it and call us, even if the reason is obvious, because of the risk of a rare side-effect.   Please come back for a follow-up appointment in 4-6 weeks.     Hyperthyroidism  Hyperthyroidism is when the thyroid gland is too active (overactive). The thyroid gland is a small gland located in the lower front part of the neck, just in front of the windpipe (trachea). This gland makes hormones that help control how the body uses food for energy (metabolism) as well as how the heart and brain function. These hormones also play a role in keeping your bones strong. When the thyroid is overactive, it produces too much of a hormone called thyroxine. What are the causes? This condition may be caused by:  Graves' disease. This is a disorder in which the body's disease-fighting system (immune system) attacks the thyroid gland. This is the most common cause.  Inflammation of the thyroid gland.  A tumor in the thyroid gland.  Use of certain medicines, including: ? Prescription thyroid hormone replacement. ? Herbal supplements that mimic thyroid hormones. ? Amiodarone therapy.  Solid or fluid-filled  lumps within your thyroid gland (thyroid nodules).  Taking in a large amount of iodine from foods or medicines. What increases the risk? You are more likely to develop this condition if:  You are male.  You have a family history of thyroid conditions.  You smoke tobacco.  You use a medicine called lithium.  You take medicines that affect the immune system (immunosuppressants). What are the signs or symptoms? Symptoms of this condition include:  Nervousness.  Inability to tolerate heat.  Unexplained weight loss.  Diarrhea.  Change in the texture of hair or skin.  Heart skipping beats or making extra beats.  Rapid heart rate.  Loss of menstruation.  Shaky hands.  Fatigue.  Restlessness.  Sleep problems.  Enlarged thyroid gland or a lump in the thyroid (nodule). You may also have symptoms of Graves' disease, which may include:  Protruding eyes.  Dry eyes.  Red or swollen eyes.  Problems with vision. How is this diagnosed? This condition may be diagnosed based on:  Your symptoms and medical history.  A physical exam.  Blood tests.  Thyroid ultrasound. This test involves using sound waves to produce images of the thyroid gland.  A thyroid scan. A radioactive substance is injected into a vein, and images show how much iodine is present in the thyroid.  Radioactive iodine uptake test (RAIU). A small amount of radioactive iodine is given by mouth to see how much iodine the thyroid absorbs after a certain amount of time. How is this treated? Treatment depends on the cause and severity of the condition. Treatment may include:  Medicines to reduce the amount of thyroid hormone your body makes.  Radioactive iodine treatment (radioiodine therapy). This involves swallowing a small dose of radioactive iodine, in capsule or liquid form, to kill thyroid cells.  Surgery to remove part or all of your thyroid gland. You may need to take thyroid hormone  replacement medicine for the rest of your life after thyroid surgery.  Medicines to help manage your symptoms. Follow these instructions at home:   Take over-the-counter and prescription medicines only as told by your  health care provider.  Do not use any products that contain nicotine or tobacco, such as cigarettes and e-cigarettes. If you need help quitting, ask your health care provider.  Follow any instructions from your health care provider about diet. You may be instructed to limit foods that contain iodine.  Keep all follow-up visits as told by your health care provider. This is important. ? You will need to have blood tests regularly so that your health care provider can monitor your condition. Contact a health care provider if:  Your symptoms do not get better with treatment.  You have a fever.  You are taking thyroid hormone replacement medicine and you: ? Have symptoms of depression. ? Feel like you are tired all the time. ? Gain weight. Get help right away if:  You have chest pain.  You have decreased alertness or a change in your awareness.  You have abdominal pain.  You feel dizzy.  You have a rapid heartbeat.  You have an irregular heartbeat.  You have difficulty breathing. Summary  The thyroid gland is a small gland located in the lower front part of the neck, just in front of the windpipe (trachea).  Hyperthyroidism is when the thyroid gland is too active (overactive) and produces too much of a hormone called thyroxine.  The most common cause is Graves' disease, a disorder in which your immune system attacks the thyroid gland.  Hyperthyroidism can cause various symptoms, such as unexplained weight loss, nervousness, inability to tolerate heat, or changes in your heartbeat.  Treatment may include medicine to reduce the amount of thyroid hormone your body makes, radioiodine therapy, surgery, or medicines to manage symptoms. This information is not  intended to replace advice given to you by your health care provider. Make sure you discuss any questions you have with your health care provider. Document Released: 09/03/2005 Document Revised: 08/16/2017 Document Reviewed: 08/14/2017 Elsevier Patient Education  2020 [employer].

## 2019-07-18 DIAGNOSIS — E0590 Thyrotoxicosis, unspecified without thyrotoxic crisis or storm: Secondary | ICD-10-CM | POA: Insufficient documentation

## 2019-08-25 ENCOUNTER — Other Ambulatory Visit: Payer: Self-pay

## 2019-08-25 ENCOUNTER — Ambulatory Visit: Payer: Medicare HMO | Admitting: Endocrinology

## 2019-08-25 ENCOUNTER — Ambulatory Visit (INDEPENDENT_AMBULATORY_CARE_PROVIDER_SITE_OTHER): Payer: Medicare HMO | Admitting: Endocrinology

## 2019-08-25 ENCOUNTER — Encounter: Payer: Self-pay | Admitting: Endocrinology

## 2019-08-25 DIAGNOSIS — E0590 Thyrotoxicosis, unspecified without thyrotoxic crisis or storm: Secondary | ICD-10-CM

## 2019-08-25 NOTE — Patient Instructions (Addendum)
Blood tests are requested for you today.  We'll let you know about the results.   If ever you have fever while taking methimazole, stop it and call us, even if the reason is obvious, because of the risk of a rare side-effect.   Please come back for a follow-up appointment in 3 months.

## 2019-08-25 NOTE — Progress Notes (Signed)
Subjective:    Patient ID: Jaime [name], male    DOB: 05/26/1933, 85 y.o.   MRN: [id number]  HPI telehealth visit today via doxy video visit.  Alternatives to telehealth are presented to this patient, and the patient agrees to the telehealth visit. Pt is advised of the cost of the visit, and agrees to this, also.   Patient is at home, and I am at the office.   Persons attending the telehealth visit: the patient, wife, dtr, and I.   Pt returns for f/u of hyperthyroidism (dx'ed mid-2020; tapazole is chosen as rx, as it is mild; he has never had thyroid imaging).  Since on tapazole, pt states he feels no different, and well in general.  In particular, appetite is improved.   Past Medical History:  Diagnosis Date   BPH (benign prostatic hyperplasia)    Coronary atherosclerosis of native coronary artery    Minimal coronary atherosclerosis at catheterization 2003   Diverticulosis of colon    Exertional dyspnea    History of colonic polyps    History of exercise stress test 05-31-2011   dr Lattie Haw   adequate but abnormal graded exercise test revealing reasonably good exercise capacity, no chest discomfort or other ischemic symptoms, borderline hypertensive blood pressure response and an ecg response consistent with myocardial ishcemia , but with very rapid resolution after the cessation of exercise suggesting a possible false positive ecg response    History of kidney stones    Left ureteral stone    Mixed hyperlipidemia    Nephrolithiasis    Osteoarthritis    Wears glasses    Wears hearing aid in both ears    Wears partial dentures    upper only    Past Surgical History:  Procedure Laterality Date   CARDIAC CATHETERIZATION  02-27-2002  dr hochrein   minimal coronary plaquing , normal lvf   CYSTOSCOPY W/ URETERAL STENT PLACEMENT Left 09/23/2017   Procedure: CYSTOSCOPY WITH RETROGRADE PYELOGRAM/URETERAL LEFT STENT PLACEMENT;  Surgeon: Ceasar Mons, MD;   Location: [facility];  Service: Urology;  Laterality: Left;   CYSTOSCOPY/URETEROSCOPY/HOLMIUM LASER/STENT PLACEMENT Left 10/02/2017   Procedure: CYSTOSCOPY/URETEROSCOPY/HOLMIUM LASER/STENT PLACEMENT, STONE BASKETRY;  Surgeon: Ceasar Mons, MD;  Location: [facility];  Service: Urology;  Laterality: Left;  ONLY NEEDS 45 MIN FOR PROCEDURE   EXTRACORPOREAL SHOCK WAVE LITHOTRIPSY  2002   TRANSTHORACIC ECHOCARDIOGRAM  05/31/2011   dr s. Domenic Polite   mild concentric LVH, ef 60-65%/  moderately calcified AV w/ mild AV stenosis, no regurg. 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  Tobacco Use   Smoking status: Never Smoker   Smokeless tobacco: Current User    Types: Chew   Tobacco comment: chew tobacco for 30 years (as of 09-30-2017)  Substance and Sexual Activity   Alcohol use: No   Drug use: No   Sexual activity: Not on file  Other Topics Concern   Not on file  Social History Narrative   Not on file   Social Determinants of Health   Financial Resource Strain:    Difficulty of Paying Living Expenses: Not on file  Food  Insecurity:    Worried About Charity fundraiser in the Last Year: Not on file   [employer] of Food in the Last Year: Not on file  Transportation Needs:    Lack of Transportation (Medical): Not on file   Lack of Transportation (Non-Medical): Not on file  Physical Activity:    Days of Exercise per Week: Not on file   Minutes of Exercise per Session: Not on file  Stress:    Feeling of Stress : Not on file  Social Connections:    Frequency of Communication with Friends and Family: Not on file   Frequency of Social Gatherings  with Friends and Family: Not on file   Attends Religious Services: Not on file   Active Member of Clubs or Organizations: Not on file   Attends Archivist Meetings: Not on file   Marital Status: Not on file  Intimate Partner Violence:    Fear of Current or Ex-Partner: Not on file   Emotionally Abused: Not on file   Physically Abused: Not on file   Sexually Abused: Not on file    Current Outpatient Medications on File Prior to Visit  Medication Sig Dispense Refill   amLODipine (NORVASC) 5 MG tablet Take 5 mg by mouth daily.  1   amoxicillin-clavulanate (AUGMENTIN) 875-125 MG tablet Take 1 tablet by mouth every 12 (twelve) hours.     aspirin EC 81 MG tablet Take 81 mg by mouth daily.     atorvastatin (LIPITOR) 40 MG tablet Take 40 mg by mouth daily.  3   methimazole (TAPAZOLE) 5 MG tablet Take 0.5 tablets (2.5 mg total) by mouth daily. 15 tablet 5   multivitamin (ONE-A-DAY MEN'S) TABS tablet Take 1 tablet by mouth daily.     tamsulosin (FLOMAX) 0.4 MG CAPS capsule Take 0.4 mg by mouth daily.  3   No current facility-administered medications on file prior to visit.    No Known Allergies  Family History  Problem Relation Age of Onset   Cancer Father        Bone cancer   Thyroid disease Neg Hx     There were no vitals taken for this visit.  Review of Systems Denies fever    Objective:   Physical Exam      Assessment & Plan:  Hyperthyroidism: due for recheck Poor appetite: improved: uncertain if this is thyroid-related.  We'll follow  Patient Instructions  Blood tests are requested for you today.  We'll let you know about the results.   If ever you have fever while taking methimazole, stop it and call us, even if the reason is obvious, because of the risk of a rare side-effect.   Please come back for a follow-up appointment in 3 months.

## 2019-08-28 DIAGNOSIS — E0590 Thyrotoxicosis, unspecified without thyrotoxic crisis or storm: Secondary | ICD-10-CM | POA: Diagnosis not present

## 2019-08-29 LAB — SPECIMEN STATUS

## 2019-09-01 ENCOUNTER — Telehealth: Payer: Self-pay

## 2019-09-01 LAB — T4, FREE: Free T4: 1.03 ng/dL (ref 0.82–1.77)

## 2019-09-01 LAB — TSH: TSH: 1.69 u[IU]/mL (ref 0.450–4.500)

## 2019-09-01 NOTE — Telephone Encounter (Signed)
Lab results reviewed by Dr. Ellison. Letter has been mailed. For future reference, letter can be found in Epic. 

## 2019-09-01 NOTE — Telephone Encounter (Signed)
-----   Message from Renato Shin, MD sent at 09/01/2019 12:45 PM EST ----- please contact patient: Normal.  Please continue the same medication.  I'll see you next time.

## 2019-11-18 DIAGNOSIS — R69 Illness, unspecified: Secondary | ICD-10-CM | POA: Diagnosis not present

## 2019-11-24 DIAGNOSIS — M545 Low back pain: Secondary | ICD-10-CM | POA: Diagnosis not present

## 2019-11-24 DIAGNOSIS — R350 Frequency of micturition: Secondary | ICD-10-CM | POA: Diagnosis not present

## 2019-11-24 DIAGNOSIS — N401 Enlarged prostate with lower urinary tract symptoms: Secondary | ICD-10-CM | POA: Diagnosis not present

## 2019-11-24 DIAGNOSIS — Z87442 Personal history of urinary calculi: Secondary | ICD-10-CM | POA: Diagnosis not present

## 2019-12-31 ENCOUNTER — Other Ambulatory Visit: Payer: Self-pay | Admitting: Endocrinology

## 2020-02-18 DIAGNOSIS — E7849 Other hyperlipidemia: Secondary | ICD-10-CM | POA: Diagnosis not present

## 2020-02-18 DIAGNOSIS — Z6824 Body mass index (BMI) 24.0-24.9, adult: Secondary | ICD-10-CM | POA: Diagnosis not present

## 2020-02-18 DIAGNOSIS — I700 Atherosclerosis of aorta: Secondary | ICD-10-CM | POA: Diagnosis not present

## 2020-02-18 DIAGNOSIS — Z0001 Encounter for general adult medical examination with abnormal findings: Secondary | ICD-10-CM | POA: Diagnosis not present

## 2020-02-18 DIAGNOSIS — Z1389 Encounter for screening for other disorder: Secondary | ICD-10-CM | POA: Diagnosis not present

## 2020-02-18 DIAGNOSIS — R69 Illness, unspecified: Secondary | ICD-10-CM | POA: Diagnosis not present

## 2020-02-18 DIAGNOSIS — E039 Hypothyroidism, unspecified: Secondary | ICD-10-CM | POA: Diagnosis not present

## 2020-02-18 DIAGNOSIS — M1991 Primary osteoarthritis, unspecified site: Secondary | ICD-10-CM | POA: Diagnosis not present

## 2020-02-18 DIAGNOSIS — I10 Essential (primary) hypertension: Secondary | ICD-10-CM | POA: Diagnosis not present

## 2020-02-18 DIAGNOSIS — N400 Enlarged prostate without lower urinary tract symptoms: Secondary | ICD-10-CM | POA: Diagnosis not present

## 2020-02-18 DIAGNOSIS — E782 Mixed hyperlipidemia: Secondary | ICD-10-CM | POA: Diagnosis not present

## 2020-02-18 DIAGNOSIS — Z125 Encounter for screening for malignant neoplasm of prostate: Secondary | ICD-10-CM | POA: Diagnosis not present

## 2020-02-18 DIAGNOSIS — N401 Enlarged prostate with lower urinary tract symptoms: Secondary | ICD-10-CM | POA: Diagnosis not present

## 2020-02-18 DIAGNOSIS — I2510 Atherosclerotic heart disease of native coronary artery without angina pectoris: Secondary | ICD-10-CM | POA: Diagnosis not present

## 2020-03-15 DIAGNOSIS — H25813 Combined forms of age-related cataract, bilateral: Secondary | ICD-10-CM | POA: Diagnosis not present

## 2020-05-17 DIAGNOSIS — H01001 Unspecified blepharitis right upper eyelid: Secondary | ICD-10-CM | POA: Diagnosis not present

## 2020-05-17 DIAGNOSIS — H01004 Unspecified blepharitis left upper eyelid: Secondary | ICD-10-CM | POA: Diagnosis not present

## 2020-05-17 DIAGNOSIS — H01002 Unspecified blepharitis right lower eyelid: Secondary | ICD-10-CM | POA: Diagnosis not present

## 2020-05-17 DIAGNOSIS — H2513 Age-related nuclear cataract, bilateral: Secondary | ICD-10-CM | POA: Diagnosis not present

## 2020-05-31 DIAGNOSIS — R69 Illness, unspecified: Secondary | ICD-10-CM | POA: Diagnosis not present

## 2020-06-14 NOTE — H&P (Signed)
Surgical History & Physical  Patient Name: Jaime [name] DOB: [birth year]  Surgery: Cataract extraction with intraocular lens implant phacoemulsification; Left Eye  Surgeon: Baruch Goldmann MD Surgery Date:  06/27/2020 Pre-Op Date:  06/14/2020  HPI: A 8 Yr. old male patient Pt present for cataract evaluation. The patient complains of difficulty when seeing street signs. Both eyes are affected. The episode is constant. The condition's severity is worsening. Distance vision is worse than near. This is negatively affecting the patient's quality of life. The patient experiences no flashes and no floaters. HPI was performed by Baruch Goldmann .  Medical History: Cataracts Elevated cholesterol  Review of Systems Negative Allergic/Immunologic Negative Cardiovascular Negative Constitutional Negative Ear, Nose, Mouth & Throat Negative Endocrine Negative Eyes Negative Gastrointestinal Negative Genitourinary Negative Hemotologic/Lymphatic Negative Integumentary Negative Musculoskeletal Negative Neurological Negative Psychiatry Negative Respiratory  Social   Never smoked  Medication TheraTears,  Atorvastatin, Amlodipine, Fluticasone,   Sx/Procedures Ocular Other  None  Drug Allergies   NKDA  History & Physical: Heent:  Cataract, Left eye NECK: supple without bruits LUNGS: lungs clear to auscultation CV: regular rate and rhythm Abdomen: soft and non-tender  Impression & Plan: Assessment: 1.  NUCLEAR SCLEROSIS AGE RELATED; Both Eyes (H25.13) 2.  BLEPHARITIS; Right Upper Lid, Right Lower Lid, Left Upper Lid, Left Lower Lid (H01.001, H01.002,H01.004,H01.005) 3.  Pinguecula; Both Eyes (H11.153) 4.  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  Cataract accounts for the patient's decreased vision. This visual impairment is not correctable with a tolerable change in glasses or contact lenses. Cataract surgery with an implantation of a new lens should significantly improve the  visual and functional status of the patient. Discussed all risks, benefits, alternatives, and potential complications. Discussed the procedures and recovery. Patient desires to have surgery. A-scan ordered and performed today for intra-ocular lens calculations. The surgery will be performed in order to improve vision for driving, reading, and for eye examinations. Recommend phacoemulsification with intra-ocular lens. Recommend Dextenza for post-operative pain and inflammation. Left Eye. Dilates poorly - shugacaine by protocol. Malyugin Ring. Omidira. 2.  Begin/continue lid scrubs. 3.  Observe; Artificial tears as needed for irritation. 4.  Discussed significance of finding

## 2020-06-20 DIAGNOSIS — H2512 Age-related nuclear cataract, left eye: Secondary | ICD-10-CM | POA: Diagnosis not present

## 2020-06-23 ENCOUNTER — Encounter (HOSPITAL_COMMUNITY)
Admission: RE | Admit: 2020-06-23 | Discharge: 2020-06-23 | Disposition: A | Discharge status: Home / Self Care | Payer: Medicare HMO | Source: Ambulatory Visit | Attending: Ophthalmology | Admitting: Ophthalmology

## 2020-06-23 ENCOUNTER — Other Ambulatory Visit: Payer: Self-pay

## 2020-06-23 ENCOUNTER — Other Ambulatory Visit (HOSPITAL_COMMUNITY)
Admission: RE | Admit: 2020-06-23 | Discharge: 2020-06-23 | Disposition: A | Discharge status: Home / Self Care | Payer: Medicare HMO | Source: Ambulatory Visit | Attending: Ophthalmology | Admitting: Ophthalmology

## 2020-06-23 DIAGNOSIS — Z20822 Contact with and (suspected) exposure to covid-19: Secondary | ICD-10-CM | POA: Diagnosis not present

## 2020-06-23 DIAGNOSIS — Z01818 Encounter for other preprocedural examination: Secondary | ICD-10-CM | POA: Insufficient documentation

## 2020-06-23 LAB — SARS CORONAVIRUS 2 (TAT 6-24 HRS): SARS Coronavirus 2: NEGATIVE

## 2020-06-27 ENCOUNTER — Other Ambulatory Visit: Payer: Self-pay

## 2020-06-27 ENCOUNTER — Ambulatory Visit (HOSPITAL_COMMUNITY): Payer: Medicare HMO | Admitting: Anesthesiology

## 2020-06-27 ENCOUNTER — Encounter (HOSPITAL_COMMUNITY): Payer: Self-pay | Admitting: Ophthalmology

## 2020-06-27 ENCOUNTER — Ambulatory Visit (HOSPITAL_COMMUNITY)
Admission: RE | Admit: 2020-06-27 | Discharge: 2020-06-27 | Disposition: A | Discharge status: Home / Self Care | Payer: Medicare HMO | Attending: Ophthalmology | Admitting: Ophthalmology

## 2020-06-27 ENCOUNTER — Encounter (HOSPITAL_COMMUNITY)
Admission: RE | Disposition: A | Discharge status: Home / Self Care | Payer: Self-pay | Source: Home / Self Care | Attending: Ophthalmology

## 2020-06-27 DIAGNOSIS — E7800 Pure hypercholesterolemia, unspecified: Secondary | ICD-10-CM | POA: Diagnosis not present

## 2020-06-27 DIAGNOSIS — H2512 Age-related nuclear cataract, left eye: Secondary | ICD-10-CM | POA: Insufficient documentation

## 2020-06-27 DIAGNOSIS — H0100A Unspecified blepharitis right eye, upper and lower eyelids: Secondary | ICD-10-CM | POA: Insufficient documentation

## 2020-06-27 DIAGNOSIS — H18413 Arcus senilis, bilateral: Secondary | ICD-10-CM | POA: Diagnosis not present

## 2020-06-27 DIAGNOSIS — H2181 Floppy iris syndrome: Secondary | ICD-10-CM | POA: Diagnosis not present

## 2020-06-27 DIAGNOSIS — H0100B Unspecified blepharitis left eye, upper and lower eyelids: Secondary | ICD-10-CM | POA: Diagnosis not present

## 2020-06-27 HISTORY — PX: CATARACT EXTRACTION W/PHACO: SHX586

## 2020-06-27 SURGERY — PHACOEMULSIFICATION, CATARACT, WITH IOL INSERTION
Anesthesia: Monitor Anesthesia Care | Site: Eye | Laterality: Left

## 2020-06-27 MED ORDER — POVIDONE-IODINE 5 % OP SOLN
OPHTHALMIC | Status: DC | PRN
  Administered 2020-06-27: 1 via OPHTHALMIC

## 2020-06-27 MED ORDER — NEOMYCIN-POLYMYXIN-DEXAMETH 3.5-10000-0.1 OP SUSP
OPHTHALMIC | Status: DC | PRN
  Administered 2020-06-27: 1 [drp] via OPHTHALMIC

## 2020-06-27 MED ORDER — LIDOCAINE HCL (PF) 1 % IJ SOLN
INTRAOCULAR | Status: DC | PRN
  Administered 2020-06-27: 1 mL via OPHTHALMIC

## 2020-06-27 MED ORDER — PHENYLEPHRINE-KETOROLAC 1-0.3 % IO SOLN
INTRAOCULAR | Status: DC | PRN
  Administered 2020-06-27: 500 mL via OPHTHALMIC

## 2020-06-27 MED ORDER — PROVISC 10 MG/ML IO SOLN
INTRAOCULAR | Status: DC | PRN
  Administered 2020-06-27: 0.85 mL via INTRAOCULAR

## 2020-06-27 MED ORDER — EPINEPHRINE PF 1 MG/ML IJ SOLN
INTRAMUSCULAR | Status: AC
  Filled 2020-06-27: qty 1

## 2020-06-27 MED ORDER — LIDOCAINE HCL 3.5 % OP GEL
1.0000 "application " | Freq: Once | OPHTHALMIC | Status: AC
  Administered 2020-06-27: 1 via OPHTHALMIC

## 2020-06-27 MED ORDER — PHENYLEPHRINE HCL 2.5 % OP SOLN
1.0000 [drp] | OPHTHALMIC | Status: AC | PRN
  Administered 2020-06-27 (×3): 1 [drp] via OPHTHALMIC

## 2020-06-27 MED ORDER — TETRACAINE HCL 0.5 % OP SOLN
1.0000 [drp] | OPHTHALMIC | Status: AC | PRN
  Administered 2020-06-27 (×3): 1 [drp] via OPHTHALMIC

## 2020-06-27 MED ORDER — SODIUM HYALURONATE 23 MG/ML IO SOLN
INTRAOCULAR | Status: DC | PRN
  Administered 2020-06-27: 0.6 mL via INTRAOCULAR

## 2020-06-27 MED ORDER — CYCLOPENTOLATE-PHENYLEPHRINE 0.2-1 % OP SOLN
1.0000 [drp] | OPHTHALMIC | Status: AC | PRN
  Administered 2020-06-27 (×3): 1 [drp] via OPHTHALMIC

## 2020-06-27 MED ORDER — BSS IO SOLN
INTRAOCULAR | Status: DC | PRN
  Administered 2020-06-27: 15 mL via INTRAOCULAR

## 2020-06-27 MED ORDER — PHENYLEPHRINE-KETOROLAC 1-0.3 % IO SOLN
INTRAOCULAR | Status: AC
  Filled 2020-06-27: qty 4

## 2020-06-27 SURGICAL SUPPLY — 16 items
CLOTH BEACON ORANGE TIMEOUT ST (SAFETY) ×2 IMPLANT
DEVICE MILOOP (MISCELLANEOUS) IMPLANT
EYE SHIELD UNIVERSAL CLEAR (GAUZE/BANDAGES/DRESSINGS) ×2 IMPLANT
GLOVE BIOGEL PI IND STRL 7.0 (GLOVE) ×2 IMPLANT
GLOVE BIOGEL PI INDICATOR 7.0 (GLOVE) ×2
LENS ALC ACRYL/TECN (Ophthalmic Related) ×2 IMPLANT
MILOOP DEVICE (MISCELLANEOUS)
NEEDLE HYPO 18GX1.5 BLUNT FILL (NEEDLE) ×2 IMPLANT
PAD ARMBOARD 7.5X6 YLW CONV (MISCELLANEOUS) ×2 IMPLANT
RING MALYGIN (MISCELLANEOUS) IMPLANT
RING MALYGIN 7.0 (MISCELLANEOUS) ×2 IMPLANT
SYR TB 1ML LL NO SAFETY (SYRINGE) ×2 IMPLANT
TAPE SURG TRANSPORE 1 IN (GAUZE/BANDAGES/DRESSINGS) ×1 IMPLANT
TAPE SURGICAL TRANSPORE 1 IN (GAUZE/BANDAGES/DRESSINGS) ×2
VISCOELASTIC ADDITIONAL (OPHTHALMIC RELATED) ×2 IMPLANT
WATER STERILE IRR 250ML POUR (IV SOLUTION) ×2 IMPLANT

## 2020-06-27 NOTE — Anesthesia Preprocedure Evaluation (Addendum)
Anesthesia Evaluation  Patient identified by MRN, date of birth, ID band Patient awake    Reviewed: Allergy & Precautions, NPO status , Patient's Chart, lab work & pertinent test results  History of Anesthesia Complications Negative for: history of anesthetic complications  Airway Mallampati: II  TM Distance: >3 FB Neck ROM: Full    Dental  (+) Upper Dentures   Pulmonary shortness of breath and with exertion,    Pulmonary exam normal breath sounds clear to auscultation       Cardiovascular Exercise Tolerance: Good + CAD  Normal cardiovascular exam+ Valvular Problems/Murmurs  Rhythm:Regular Rate:Normal     Neuro/Psych negative neurological ROS  negative psych ROS   GI/Hepatic negative GI ROS, Neg liver ROS,   Endo/Other  Hyperthyroidism   Renal/GU Renal disease     Musculoskeletal  (+) Arthritis ,   Abdominal   Peds  Hematology negative hematology ROS (+)   Anesthesia Other Findings   Reproductive/Obstetrics negative OB ROS                            Anesthesia Physical Anesthesia Plan  ASA: II  Anesthesia Plan: MAC   Post-op Pain Management:    Induction:   PONV Risk Score and Plan:   Airway Management Planned: Nasal Cannula and Natural Airway  Additional Equipment:   Intra-op Plan:   Post-operative Plan:   Informed Consent: I have reviewed the patients History and Physical, chart, labs and discussed the procedure including the risks, benefits and alternatives for the proposed anesthesia with the patient or authorized representative who has indicated his/her understanding and acceptance.       Plan Discussed with: CRNA and Surgeon  Anesthesia Plan Comments:         Anesthesia Quick Evaluation

## 2020-06-27 NOTE — Anesthesia Postprocedure Evaluation (Signed)
Anesthesia Post Note  Patient: Jaime [name]  Procedure(s) Performed: CATARACT EXTRACTION PHACO AND INTRAOCULAR LENS PLACEMENT LEFT EYE (Left Eye)  Patient location during evaluation: Short Stay Anesthesia Type: MAC Level of consciousness: awake and alert and patient cooperative Pain management: satisfactory to patient Vital Signs Assessment: post-procedure vital signs reviewed and stable Respiratory status: spontaneous breathing Cardiovascular status: stable Postop Assessment: no apparent nausea or vomiting Anesthetic complications: no   No complications documented.   Last Vitals:  Vitals:   06/27/20 0944  BP: (!) 149/70  Pulse: 80  Resp: 19  Temp: 36.4 C  SpO2: 97%    Last Pain:  Vitals:   06/27/20 0944  TempSrc: Oral  PainSc: 0-No pain                 Soyla Bainter

## 2020-06-27 NOTE — Interval H&P Note (Signed)
History and Physical Interval Note:  06/27/2020 11:02 AM  Jaime [name]  has presented today for surgery, with the diagnosis of Nuclear sclerotic cataract - Left eye.  The various methods of treatment have been discussed with the patient and family. After consideration of risks, benefits and other options for treatment, the patient has consented to  Procedure(s) with comments: CATARACT EXTRACTION PHACO AND INTRAOCULAR LENS PLACEMENT LEFT EYE (Left) - left as a surgical intervention.  The patient's history has been reviewed, patient examined, no change in status, stable for surgery.  I have reviewed the patient's chart and labs.  Questions were answered to the patient's satisfaction.     Baruch Goldmann

## 2020-06-27 NOTE — Transfer of Care (Signed)
Immediate Anesthesia Transfer of Care Note  Patient: Jaime [name]  Procedure(s) Performed: CATARACT EXTRACTION PHACO AND INTRAOCULAR LENS PLACEMENT LEFT EYE (Left Eye)  Patient Location: Short Stay  Anesthesia Type:MAC  Level of Consciousness: awake, alert  and patient cooperative  Airway & Oxygen Therapy: Patient Spontanous Breathing  Post-op Assessment: Report given to RN and Post -op Vital signs reviewed and stable  Post vital signs: Reviewed and stable  Last Vitals:  Vitals Value Taken Time  BP    Temp    Pulse    Resp    SpO2      Last Pain:  Vitals:   06/27/20 0944  TempSrc: Oral  PainSc: 0-No pain         Complications: No complications documented.

## 2020-06-27 NOTE — Op Note (Signed)
Date of procedure: 06/27/20  Pre-operative diagnosis: Visually significant nuclear cataract, Left Eye; Poor dilation, Left eye (H25.12; H21.81)  Post-operative diagnosis: Visually significant cataract, Left Eye; Intra-operative Floppy Iris Syndrome, Left Eye  Procedure: Complex removal of cataract via phacoemulsification and insertion of intra-ocular lens [employer]  +22.5D into the capsular bag of the Left Eye (CPT [phone])  Attending surgeon: Gerda Diss. Alannis Hsia, MD, MA  Anesthesia: MAC, Topical Akten  Complications: None  Estimated Blood Loss: <88m (minimal)  Specimens: None  Implants: As above  Indications:  Visually significant cataract, Left Eye  Procedure:  The patient was seen and identified in the pre-operative area. The operative eye was identified and dilated.  The operative eye was marked.  Topical anesthesia was administered to the operative eye.     The patient was then to the operative suite and placed in the supine position.  A timeout was performed confirming the patient, procedure to be performed, and all other relevant information.   The patient's face was prepped and draped in the usual fashion for intra-ocular surgery.  A lid speculum was placed into the operative eye and the surgical microscope moved into place and focused.  Poor dilation of the iris was confirmed.  An inferotemporal paracentesis was created using a 20 gauge paracentesis blade.  Shugarcaine was injected into the anterior chamber.  Viscoelastic was injected into the anterior chamber.  A temporal clear-corneal main wound incision was created using a 2.472mmicrokeratome.  A Malyugin ring was placed.  A continuous curvilinear capsulorrhexis was initiated using an irrigating cystitome and completed using capsulorrhexis forceps.  Hydrodissection and hydrodeliniation were performed.  Viscoelastic was injected into the anterior chamber.  A phacoemulsification handpiece and a chopper as a second  instrument were used to remove the nucleus and epinucleus. The irrigation/aspiration handpiece was used to remove any remaining cortical material.   The capsular bag was reinflated with viscoelastic, checked, and found to be intact.  The intraocular lens was inserted into the capsular bag and dialed into place using a Kuglen hook. The Malyugin ring was removed.  The irrigation/aspiration handpiece was used to remove any remaining viscoelastic.  The clear corneal wound and paracentesis wounds were then hydrated and checked with Weck-Cels to be watertight.  The lid-speculum and drape was removed, and the patient's face was cleaned with a wet and dry 4x4.  Maxitrol was instilled in the eye before a clear shield was taped over the eye. The patient was taken to the post-operative care unit in good condition, having tolerated the procedure well.  Post-Op Instructions: [facility] and will receive all other orders and instructions.

## 2020-06-27 NOTE — Discharge Instructions (Addendum)
Please discharge patient when stable, will follow up today with Dr. Wrzosek at the [facility] [place] [place] office immediately following discharge.  Leave shield in place until visit.  All paperwork with discharge instructions will be given at the office.  [facility] Address:  [address]  [place], [place] [address]  

## 2020-06-27 NOTE — Anesthesia Procedure Notes (Signed)
Procedure Name: MAC Date/Time: 06/27/2020 11:06 AM Performed by: Vista Deck, CRNA Pre-anesthesia Checklist: Patient identified, Emergency Drugs available, Suction available, Timeout performed and Patient being monitored Patient Re-evaluated:Patient Re-evaluated prior to induction Oxygen Delivery Method: Nasal Cannula

## 2020-06-28 ENCOUNTER — Encounter (HOSPITAL_COMMUNITY): Payer: Self-pay | Admitting: Ophthalmology

## 2020-06-29 ENCOUNTER — Encounter (HOSPITAL_COMMUNITY): Payer: Self-pay | Admitting: Ophthalmology

## 2020-07-04 DIAGNOSIS — H2511 Age-related nuclear cataract, right eye: Secondary | ICD-10-CM | POA: Diagnosis not present

## 2020-07-05 NOTE — H&P (Signed)
Surgical History & Physical  Patient Name: Jaime [name] DOB: [birth year]  Surgery: Cataract extraction with intraocular lens implant phacoemulsification; Right Eye  Surgeon: Baruch Goldmann MD Surgery Date:  07/11/2020 Pre-Op Date:  07/04/2020  HPI: A 4 Yr. old male patient 1. 1. The patient complains of difficulty when seeing street signs, which began 3 months ago. The right eye is affected. The episode is gradual. The condition's severity increased since last visit. Symptoms occur when the patient is driving, inside and outside. This is negatively affecting the patient's quality of life. 2. The patient is returning after cataract post-op. The left eye is affected. Status post cataract post-op, which began 1 week ago: Since the last visit, the affected area feels improvement. The patient's vision is improved. Patient is following medication instructions. HPI was performed by Baruch Goldmann .  Medical History: Cataracts Corneal Arcus Elevated cholesterol  Review of Systems Negative Allergic/Immunologic Negative Cardiovascular Negative Constitutional Negative Ear, Nose, Mouth & Throat Negative Endocrine Negative Eyes Negative Gastrointestinal Negative Genitourinary Negative Hemotologic/Lymphatic Negative Integumentary Negative Musculoskeletal Negative Neurological Negative Psychiatry Negative Respiratory  Social   Never smoked   Medication TheraTears, Prednisolone-gatiflox-bromfenac,  Atorvastatin, Amlodipine, Fluticasone,   Sx/Procedures Phaco c IOL OS,   Drug Allergies   NKDA  History & Physical: Heent:  Cataract, Right eye NECK: supple without bruits LUNGS: lungs clear to auscultation CV: regular rate and rhythm Abdomen: soft and non-tender  Impression & Plan: Assessment: 1.  CATARACT EXTRACTION STATUS; Left Eye (Z98.42) 2.  INTRAOCULAR LENS IOL (Z96.1) 3.  NUCLEAR SCLEROSIS AGE RELATED; , Right Eye (H25.11)  Plan: 1.  1 week after cataract surgery. Doing  well with improved vision and normal eye pressure. Call with any problems or concerns. Continue Gati-Brom-Pred 2x/day for 3 more weeks. 2.  Stable. Doing well since surgery Continue Post-op medications 3.  Dilates poorly - shugacaine by protocol. Malyugin Ring. Cataract accounts for the patient's decreased vision. This visual impairment is not correctable with a tolerable change in glasses or contact lenses. Cataract surgery with an implantation of a new lens should significantly improve the visual and functional status of the patient. Discussed all risks, benefits, alternatives, and potential complications. Discussed the procedures and recovery. Patient desires to have surgery. A-scan ordered and performed today for intra-ocular lens calculations. The surgery will be performed in order to improve vision for driving, reading, and for eye examinations. Recommend phacoemulsification with intra-ocular lens. Recommend Dextenza for post-operative pain and inflammation. Right Eye.

## 2020-07-06 ENCOUNTER — Encounter (HOSPITAL_COMMUNITY): Payer: Self-pay

## 2020-07-06 ENCOUNTER — Encounter (HOSPITAL_COMMUNITY)
Admit: 2020-07-06 | Discharge: 2020-07-06 | Disposition: A | Discharge status: Home / Self Care | Payer: Medicare HMO | Attending: Ophthalmology | Admitting: Ophthalmology

## 2020-07-06 ENCOUNTER — Other Ambulatory Visit: Payer: Self-pay

## 2020-07-06 DIAGNOSIS — Z23 Encounter for immunization: Secondary | ICD-10-CM | POA: Diagnosis not present

## 2020-07-08 ENCOUNTER — Other Ambulatory Visit (HOSPITAL_COMMUNITY)
Admit: 2020-07-08 | Discharge: 2020-07-08 | Disposition: A | Discharge status: Home / Self Care | Payer: Medicare HMO | Source: Ambulatory Visit | Attending: Ophthalmology | Admitting: Ophthalmology

## 2020-07-08 ENCOUNTER — Other Ambulatory Visit: Payer: Self-pay

## 2020-07-08 DIAGNOSIS — Z01812 Encounter for preprocedural laboratory examination: Secondary | ICD-10-CM | POA: Insufficient documentation

## 2020-07-08 DIAGNOSIS — Z20822 Contact with and (suspected) exposure to covid-19: Secondary | ICD-10-CM | POA: Diagnosis not present

## 2020-07-08 LAB — SARS CORONAVIRUS 2 (TAT 6-24 HRS): SARS Coronavirus 2: NEGATIVE

## 2020-07-11 ENCOUNTER — Encounter (HOSPITAL_COMMUNITY)
Admission: RE | Disposition: A | Discharge status: Home / Self Care | Payer: Self-pay | Source: Home / Self Care | Attending: Ophthalmology

## 2020-07-11 ENCOUNTER — Encounter (HOSPITAL_COMMUNITY): Payer: Self-pay | Admitting: Ophthalmology

## 2020-07-11 ENCOUNTER — Other Ambulatory Visit: Payer: Self-pay

## 2020-07-11 ENCOUNTER — Ambulatory Visit (HOSPITAL_COMMUNITY)
Admission: RE | Admit: 2020-07-11 | Discharge: 2020-07-11 | Disposition: A | Discharge status: Home / Self Care | Payer: Medicare HMO | Attending: Ophthalmology | Admitting: Ophthalmology

## 2020-07-11 ENCOUNTER — Ambulatory Visit (HOSPITAL_COMMUNITY): Payer: Medicare HMO | Admitting: Anesthesiology

## 2020-07-11 DIAGNOSIS — H2511 Age-related nuclear cataract, right eye: Secondary | ICD-10-CM | POA: Insufficient documentation

## 2020-07-11 DIAGNOSIS — I2510 Atherosclerotic heart disease of native coronary artery without angina pectoris: Secondary | ICD-10-CM | POA: Insufficient documentation

## 2020-07-11 DIAGNOSIS — Z79899 Other long term (current) drug therapy: Secondary | ICD-10-CM | POA: Diagnosis not present

## 2020-07-11 DIAGNOSIS — H2181 Floppy iris syndrome: Secondary | ICD-10-CM | POA: Insufficient documentation

## 2020-07-11 DIAGNOSIS — E0590 Thyrotoxicosis, unspecified without thyrotoxic crisis or storm: Secondary | ICD-10-CM | POA: Insufficient documentation

## 2020-07-11 DIAGNOSIS — R0602 Shortness of breath: Secondary | ICD-10-CM | POA: Diagnosis not present

## 2020-07-11 DIAGNOSIS — Z9842 Cataract extraction status, left eye: Secondary | ICD-10-CM | POA: Insufficient documentation

## 2020-07-11 DIAGNOSIS — M1990 Unspecified osteoarthritis, unspecified site: Secondary | ICD-10-CM | POA: Insufficient documentation

## 2020-07-11 HISTORY — PX: CATARACT EXTRACTION W/PHACO: SHX586

## 2020-07-11 SURGERY — PHACOEMULSIFICATION, CATARACT, WITH IOL INSERTION
Anesthesia: Monitor Anesthesia Care | Site: Eye | Laterality: Right

## 2020-07-11 MED ORDER — LIDOCAINE HCL 3.5 % OP GEL
1.0000 "application " | Freq: Once | OPHTHALMIC | Status: AC
  Administered 2020-07-11: 1 via OPHTHALMIC

## 2020-07-11 MED ORDER — PHENYLEPHRINE-KETOROLAC 1-0.3 % IO SOLN
INTRAOCULAR | Status: DC | PRN
  Administered 2020-07-11: 500 mL via OPHTHALMIC

## 2020-07-11 MED ORDER — SODIUM HYALURONATE 23 MG/ML IO SOLN
INTRAOCULAR | Status: DC | PRN
  Administered 2020-07-11: 0.6 mL via INTRAOCULAR

## 2020-07-11 MED ORDER — PHENYLEPHRINE HCL 2.5 % OP SOLN
1.0000 [drp] | OPHTHALMIC | Status: AC | PRN
  Administered 2020-07-11 (×3): 1 [drp] via OPHTHALMIC

## 2020-07-11 MED ORDER — NEOMYCIN-POLYMYXIN-DEXAMETH 3.5-10000-0.1 OP SUSP
OPHTHALMIC | Status: DC | PRN
  Administered 2020-07-11: 1 [drp] via OPHTHALMIC

## 2020-07-11 MED ORDER — PHENYLEPHRINE-KETOROLAC 1-0.3 % IO SOLN
INTRAOCULAR | Status: AC
  Filled 2020-07-11: qty 4

## 2020-07-11 MED ORDER — LIDOCAINE HCL (PF) 1 % IJ SOLN
INTRAOCULAR | Status: DC | PRN
  Administered 2020-07-11: 1 mL via OPHTHALMIC

## 2020-07-11 MED ORDER — TETRACAINE HCL 0.5 % OP SOLN
1.0000 [drp] | OPHTHALMIC | Status: AC | PRN
  Administered 2020-07-11 (×3): 1 [drp] via OPHTHALMIC

## 2020-07-11 MED ORDER — BSS IO SOLN
INTRAOCULAR | Status: DC | PRN
  Administered 2020-07-11: 15 mL via INTRAOCULAR

## 2020-07-11 MED ORDER — PROVISC 10 MG/ML IO SOLN
INTRAOCULAR | Status: DC | PRN
  Administered 2020-07-11: 0.85 mL via INTRAOCULAR

## 2020-07-11 MED ORDER — CYCLOPENTOLATE-PHENYLEPHRINE 0.2-1 % OP SOLN
1.0000 [drp] | OPHTHALMIC | Status: AC | PRN
  Administered 2020-07-11 (×3): 1 [drp] via OPHTHALMIC

## 2020-07-11 MED ORDER — POVIDONE-IODINE 5 % OP SOLN
OPHTHALMIC | Status: DC | PRN
  Administered 2020-07-11: 1 via OPHTHALMIC

## 2020-07-11 SURGICAL SUPPLY — 17 items
CLOTH BEACON ORANGE TIMEOUT ST (SAFETY) ×2 IMPLANT
EYE SHIELD UNIVERSAL CLEAR (GAUZE/BANDAGES/DRESSINGS) ×2 IMPLANT
GLOVE BIOGEL PI IND STRL 7.0 (GLOVE) ×1 IMPLANT
GLOVE BIOGEL PI IND STRL 7.5 (GLOVE) ×2 IMPLANT
GLOVE BIOGEL PI INDICATOR 7.0 (GLOVE) ×1
GLOVE BIOGEL PI INDICATOR 7.5 (GLOVE) ×2
GOWN STRL REUS W/ TWL XL LVL3 (GOWN DISPOSABLE) ×1 IMPLANT
GOWN STRL REUS W/TWL XL LVL3 (GOWN DISPOSABLE) ×2
LENS ALC ACRYL/TECN (Ophthalmic Related) ×2 IMPLANT
NEEDLE HYPO 18GX1.5 BLUNT FILL (NEEDLE) ×2 IMPLANT
PAD ARMBOARD 7.5X6 YLW CONV (MISCELLANEOUS) ×2 IMPLANT
RING MALYGIN 7.0 (MISCELLANEOUS) IMPLANT
SYR TB 1ML LL NO SAFETY (SYRINGE) ×2 IMPLANT
TAPE SURG TRANSPORE 1 IN (GAUZE/BANDAGES/DRESSINGS) ×1 IMPLANT
TAPE SURGICAL TRANSPORE 1 IN (GAUZE/BANDAGES/DRESSINGS) ×2
VISCOELASTIC ADDITIONAL (OPHTHALMIC RELATED) IMPLANT
WATER STERILE IRR 250ML POUR (IV SOLUTION) ×2 IMPLANT

## 2020-07-11 NOTE — Interval H&P Note (Signed)
History and Physical Interval Note:  07/11/2020 8:00 AM  Jaime [name]  has presented today for surgery, with the diagnosis of Nuclear sclerotic cataract - Right eye.  The various methods of treatment have been discussed with the patient and family. After consideration of risks, benefits and other options for treatment, the patient has consented to  Procedure(s) with comments: CATARACT EXTRACTION PHACO AND INTRAOCULAR LENS PLACEMENT (IOC) (Right) - right as a surgical intervention.  The patient's history has been reviewed, patient examined, no change in status, stable for surgery.  I have reviewed the patient's chart and labs.  Questions were answered to the patient's satisfaction.     Baruch Goldmann

## 2020-07-11 NOTE — Anesthesia Preprocedure Evaluation (Signed)
Anesthesia Evaluation  Patient identified by MRN, date of birth, ID band Patient awake    Reviewed: Allergy & Precautions, NPO status , Patient's Chart, lab work & pertinent test results  History of Anesthesia Complications Negative for: history of anesthetic complications  Airway Mallampati: II  TM Distance: >3 FB Neck ROM: Full    Dental  (+) Upper Dentures   Pulmonary shortness of breath and with exertion,    Pulmonary exam normal breath sounds clear to auscultation       Cardiovascular Exercise Tolerance: Good + CAD  Normal cardiovascular exam+ Valvular Problems/Murmurs  Rhythm:Regular Rate:Normal     Neuro/Psych negative neurological ROS  negative psych ROS   GI/Hepatic negative GI ROS, Neg liver ROS,   Endo/Other  Hyperthyroidism   Renal/GU Renal disease     Musculoskeletal  (+) Arthritis ,   Abdominal   Peds  Hematology negative hematology ROS (+)   Anesthesia Other Findings   Reproductive/Obstetrics negative OB ROS                             Anesthesia Physical  Anesthesia Plan  ASA: II  Anesthesia Plan: MAC   Post-op Pain Management:    Induction:   PONV Risk Score and Plan:   Airway Management Planned: Nasal Cannula and Natural Airway  Additional Equipment:   Intra-op Plan:   Post-operative Plan:   Informed Consent: I have reviewed the patients History and Physical, chart, labs and discussed the procedure including the risks, benefits and alternatives for the proposed anesthesia with the patient or authorized representative who has indicated his/her understanding and acceptance.       Plan Discussed with: CRNA and Surgeon  Anesthesia Plan Comments:         Anesthesia Quick Evaluation

## 2020-07-11 NOTE — Op Note (Signed)
Date of procedure: 07/11/20  Pre-operative diagnosis: Visually significant nuclear cataract, Right Eye; Poor Dilation, Right Eye (H25.11; H21.81)  Post-operative diagnosis: Visually significant nuclear cataract, Right Eye; Intra-operative Floppy Iris Syndrome, Right Eye  Procedure: Removal of cataract via phacoemulsification and insertion of intra-ocular lens [employer] [id number]  +22.5D into the capsular bag of the Right Eye (CPT [phone])  Attending surgeon: Gerda Diss. Landrey Mahurin, MD, MA  Anesthesia: MAC, Topical Akten  Complications: None  Estimated Blood Loss: <31m (minimal)  Specimens: None  Implants: As above  Indications:  Visually significant cataract, Right Eye  Procedure:  The patient was seen and identified in the pre-operative area. The operative eye was identified and dilated.  The operative eye was marked.  Topical anesthesia was administered to the operative eye.     The patient was then to the operative suite and placed in the supine position.  A timeout was performed confirming the patient, procedure to be performed, and all other relevant information.   The patient's face was prepped and draped in the usual fashion for intra-ocular surgery.  A lid speculum was placed into the operative eye and the surgical microscope moved into place and focused.  Poor dilation of the iris was confirmed.  A superotemporal paracentesis was created using a 20 gauge paracentesis blade.  Shugarcaine was injected into the anterior chamber.  Viscoelastic was injected into the anterior chamber.  A temporal clear-corneal main wound incision was created using a 2.414mmicrokeratome.  A Malyugin ring was placed.  A continuous curvilinear capsulorrhexis was initiated using an irrigating cystitome and completed using capsulorrhexis forceps.  Hydrodissection and hydrodeliniation were performed.  Viscoelastic was injected into the anterior chamber.  A phacoemulsification handpiece and a chopper as a  second instrument were used to remove the nucleus and epinucleus. The irrigation/aspiration handpiece was used to remove any remaining cortical material.   The capsular bag was reinflated with viscoelastic, checked, and found to be intact.  The intraocular lens was inserted into the capsular bag and dialed into place using a Kuglen hook.  The Malyugin ring was removed.  The irrigation/aspiration handpiece was used to remove any remaining viscoelastic.  The clear corneal wound and paracentesis wounds were then hydrated and checked with Weck-Cels to be watertight.  The lid-speculum and drape was removed, and the patient's face was cleaned with a wet and dry 4x4.  Maxitrol was instilled in the eye before a clear shield was taped over the eye. The patient was taken to the post-operative care unit in good condition, having tolerated the procedure well.  Post-Op Instructions: [facility] and will receive all other orders and instructions.

## 2020-07-11 NOTE — Anesthesia Postprocedure Evaluation (Signed)
Anesthesia Post Note  Patient: Jaime [name]  Procedure(s) Performed: CATARACT EXTRACTION PHACO AND INTRAOCULAR LENS PLACEMENT (IOC) (Right Eye)  Anesthesia Type: MAC Level of consciousness: awake and alert and oriented Pain management: pain level controlled Vital Signs Assessment: post-procedure vital signs reviewed and stable Respiratory status: spontaneous breathing, nonlabored ventilation and respiratory function stable Cardiovascular status: blood pressure returned to baseline Postop Assessment: no headache and no apparent nausea or vomiting Anesthetic complications: no   No complications documented.   Last Vitals:  Vitals:   07/11/20 0701 07/11/20 0833  BP:  116/61  Pulse: 65 63  Resp: 17 18  Temp: 36.6 C 37.1 C  SpO2: 98% 96%    Last Pain:  Vitals:   07/11/20 0833  TempSrc: Oral  PainSc: 0-No pain                 Orlie Dakin

## 2020-07-11 NOTE — Discharge Instructions (Signed)
Please discharge patient when stable, will follow up today with Dr. Iasha Mccalister at the [facility] [place] office immediately following discharge.  Leave shield in place until visit.  All paperwork with discharge instructions will be given at the office.  [facility] Address:  [address]  [place], [place] [address]  

## 2020-07-11 NOTE — Transfer of Care (Signed)
Immediate Anesthesia Transfer of Care Note  Patient: Jaime [name]  Procedure(s) Performed: CATARACT EXTRACTION PHACO AND INTRAOCULAR LENS PLACEMENT (IOC) (Right Eye)  Patient Location: Short Stay  Anesthesia Type:MAC  Level of Consciousness: awake, alert  and oriented  Airway & Oxygen Therapy: Patient Spontanous Breathing  Post-op Assessment: Report given to RN, Post -op Vital signs reviewed and stable and Patient moving all extremities X 4  Post vital signs: Reviewed and stable  Last Vitals:  Vitals Value Taken Time  BP 116/61 07/11/20 0833  Temp 37.1 C 07/11/20 0833  Pulse 63 07/11/20 0833  Resp 18 07/11/20 0833  SpO2 96 % 07/11/20 0833    Last Pain:  Vitals:   07/11/20 0833  TempSrc: Oral  PainSc: 0-No pain      Patients Stated Pain Goal: 5 (75/30/05 1102)  Complications: No complications documented.

## 2020-07-13 ENCOUNTER — Encounter (HOSPITAL_COMMUNITY): Payer: Self-pay | Admitting: Ophthalmology

## 2020-08-22 DIAGNOSIS — H02004 Unspecified entropion of left upper eyelid: Secondary | ICD-10-CM | POA: Diagnosis not present

## 2020-09-14 ENCOUNTER — Emergency Department (HOSPITAL_COMMUNITY): Payer: Medicare HMO

## 2020-09-14 ENCOUNTER — Emergency Department (HOSPITAL_COMMUNITY)
Admission: EM | Admit: 2020-09-14 | Discharge: 2020-09-14 | Disposition: A | Discharge status: Home / Self Care | Payer: Medicare HMO | Attending: Emergency Medicine | Admitting: Emergency Medicine

## 2020-09-14 ENCOUNTER — Encounter (HOSPITAL_COMMUNITY): Payer: Self-pay | Admitting: *Deleted

## 2020-09-14 ENCOUNTER — Other Ambulatory Visit: Payer: Self-pay

## 2020-09-14 DIAGNOSIS — R0600 Dyspnea, unspecified: Secondary | ICD-10-CM | POA: Diagnosis not present

## 2020-09-14 DIAGNOSIS — Z681 Body mass index (BMI) 19 or less, adult: Secondary | ICD-10-CM | POA: Diagnosis not present

## 2020-09-14 DIAGNOSIS — R0902 Hypoxemia: Secondary | ICD-10-CM | POA: Diagnosis not present

## 2020-09-14 DIAGNOSIS — Z951 Presence of aortocoronary bypass graft: Secondary | ICD-10-CM | POA: Diagnosis not present

## 2020-09-14 DIAGNOSIS — M791 Myalgia, unspecified site: Secondary | ICD-10-CM | POA: Diagnosis not present

## 2020-09-14 DIAGNOSIS — Z20822 Contact with and (suspected) exposure to covid-19: Secondary | ICD-10-CM | POA: Diagnosis not present

## 2020-09-14 DIAGNOSIS — M1991 Primary osteoarthritis, unspecified site: Secondary | ICD-10-CM | POA: Diagnosis not present

## 2020-09-14 DIAGNOSIS — I25119 Atherosclerotic heart disease of native coronary artery with unspecified angina pectoris: Secondary | ICD-10-CM | POA: Insufficient documentation

## 2020-09-14 DIAGNOSIS — R059 Cough, unspecified: Secondary | ICD-10-CM | POA: Diagnosis not present

## 2020-09-14 DIAGNOSIS — I10 Essential (primary) hypertension: Secondary | ICD-10-CM | POA: Diagnosis not present

## 2020-09-14 DIAGNOSIS — Z7982 Long term (current) use of aspirin: Secondary | ICD-10-CM | POA: Insufficient documentation

## 2020-09-14 DIAGNOSIS — J189 Pneumonia, unspecified organism: Secondary | ICD-10-CM | POA: Diagnosis not present

## 2020-09-14 DIAGNOSIS — J9601 Acute respiratory failure with hypoxia: Secondary | ICD-10-CM | POA: Diagnosis not present

## 2020-09-14 DIAGNOSIS — R6883 Chills (without fever): Secondary | ICD-10-CM | POA: Diagnosis present

## 2020-09-14 LAB — COMPREHENSIVE METABOLIC PANEL
ALT: 19 U/L (ref 0–44)
AST: 29 U/L (ref 15–41)
Albumin: 3.8 g/dL (ref 3.5–5.0)
Alkaline Phosphatase: 83 U/L (ref 38–126)
Anion gap: 12 (ref 5–15)
BUN: 18 mg/dL (ref 8–23)
CO2: 22 mmol/L (ref 22–32)
Calcium: 8.5 mg/dL — ABNORMAL LOW (ref 8.9–10.3)
Chloride: 103 mmol/L (ref 98–111)
Creatinine, Ser: 1.13 mg/dL (ref 0.61–1.24)
GFR, Estimated: >60 mL/min (ref >60)
Glucose, Bld: 150 mg/dL — ABNORMAL HIGH (ref 70–99)
Potassium: 3.7 mmol/L (ref 3.5–5.1)
Sodium: 137 mmol/L (ref 135–145)
Total Bilirubin: 2.3 mg/dL — ABNORMAL HIGH (ref 0.3–1.2)
Total Protein: 7.9 g/dL (ref 6.5–8.1)

## 2020-09-14 LAB — CBC WITH DIFFERENTIAL/PLATELET
Abs Immature Granulocytes: 0.07 10*3/uL (ref 0.00–0.07)
Basophils Absolute: 0.1 10*3/uL (ref 0.0–0.1)
Basophils Relative: 0 %
Eosinophils Absolute: 0.5 10*3/uL (ref 0.0–0.5)
Eosinophils Relative: 3 %
HCT: 40.5 % (ref 39.0–52.0)
Hemoglobin: 13.2 g/dL (ref 13.0–17.0)
Immature Granulocytes: 0 %
Lymphocytes Relative: 13 %
Lymphs Abs: 2.1 10*3/uL (ref 0.7–4.0)
MCH: 31.3 pg (ref 26.0–34.0)
MCHC: 32.6 g/dL (ref 30.0–36.0)
MCV: 96 fL (ref 80.0–100.0)
Monocytes Absolute: 1.1 10*3/uL — ABNORMAL HIGH (ref 0.1–1.0)
Monocytes Relative: 7 %
Neutro Abs: 12.1 10*3/uL — ABNORMAL HIGH (ref 1.7–7.7)
Neutrophils Relative %: 77 %
Platelets: 332 10*3/uL (ref 150–400)
RBC: 4.22 MIL/uL (ref 4.22–5.81)
RDW: 13 % (ref 11.5–15.5)
WBC: 16 10*3/uL — ABNORMAL HIGH (ref 4.0–10.5)
nRBC: 0 % (ref 0.0–0.2)

## 2020-09-14 LAB — RESP PANEL BY RT-PCR (FLU A&B, COVID) ARPGX2
Influenza A by PCR: NEGATIVE
Influenza B by PCR: NEGATIVE
SARS Coronavirus 2 by RT PCR: NEGATIVE

## 2020-09-14 MED ORDER — AZITHROMYCIN 250 MG PO TABS: ORAL_TABLET | ORAL | 0 refills | Status: AC

## 2020-09-14 MED ORDER — AZITHROMYCIN 250 MG PO TABS: ORAL_TABLET | ORAL | 0 refills | Status: DC

## 2020-09-14 MED ORDER — SODIUM CHLORIDE 0.9 % IV SOLN
1.0000 g | Freq: Once | INTRAVENOUS | Status: AC
  Administered 2020-09-14: 1 g via INTRAVENOUS
  Filled 2020-09-14: qty 10

## 2020-09-14 MED ORDER — AMOXICILLIN 500 MG PO CAPS: 1000.0000 mg | ORAL_CAPSULE | Freq: Three times a day (TID) | ORAL | 0 refills | Status: DC

## 2020-09-14 MED ORDER — SODIUM CHLORIDE 0.9 % IV SOLN
500.0000 mg | Freq: Once | INTRAVENOUS | Status: AC
  Administered 2020-09-14: 500 mg via INTRAVENOUS
  Filled 2020-09-14: qty 500

## 2020-09-14 NOTE — ED Provider Notes (Addendum)
[facility] EMERGENCY DEPARTMENT Provider Note   CSN: [id number] Arrival date & time: 09/14/20  1058     History Chief Complaint  Patient presents with  . Chills  . Generalized Body Aches    Jaime [name] is a 86 y.o. male with past medical history significant for BPH, CAD, diverticulosis, exertional dyspnea.  Had COVID vaccinations including booster.  HPI Patient presents to emergency department today with chief complaint of chills, body aches,and cough x 6 days.  Patient states his cough has been productive with yellow phlegm.  He states he has been feeling cold all week and has had decreased p.o. intake secondary to feeling poorly.  Went to his primary care provider's office today and was found to have low oxygen levels and sent to evaluation.  He has not taken any medications for symptoms prior to arrival.  Patient denies any fever, congestion, shortness of breath, chest pain, abdominal pain, nausea, emesis, urinary symptoms, diarrhea.  Denies any sick contacts or known Covid exposures.    Past Medical History:  Diagnosis Date  . BPH (benign prostatic hyperplasia)   . Coronary atherosclerosis of native coronary artery    Minimal coronary atherosclerosis at catheterization 2003  . Diverticulosis of colon   . Exertional dyspnea   . History of colonic polyps   . History of exercise stress test 05-31-2011   dr Lattie Haw   adequate but abnormal graded exercise test revealing reasonably good exercise capacity, no chest discomfort or other ischemic symptoms, borderline hypertensive blood pressure response and an ecg response consistent with myocardial ishcemia , but with very rapid resolution after the cessation of exercise suggesting a possible false positive ecg response   . History of kidney stones   . Left ureteral stone   . Mixed hyperlipidemia   . Nephrolithiasis   . Osteoarthritis   . Wears glasses   . Wears hearing aid in both ears   . Wears partial dentures    upper only     Patient Active Problem List   Diagnosis Date Noted  . Hyperthyroidism 07/18/2019  . Difficulty in walking(719.7) 03/26/2012  . Elevated bilirubin 03/25/2012  . Shortness of breath 05/16/2011  . Undiagnosed cardiac murmurs 05/16/2011  . Dizziness and giddiness 05/16/2011  . Mixed hyperlipidemia 05/16/2011  . Coronary atherosclerosis of native coronary artery 05/16/2011    Past Surgical History:  Procedure Laterality Date  . CARDIAC CATHETERIZATION  02-27-2002  dr hochrein   minimal coronary plaquing , normal lvf  . CATARACT EXTRACTION W/PHACO Left 06/27/2020   Procedure: CATARACT EXTRACTION PHACO AND INTRAOCULAR LENS PLACEMENT LEFT EYE;  Surgeon: Baruch Goldmann, MD;  Location: [facility];  Service: Ophthalmology;  Laterality: Left;  CDE:16.96  . CATARACT EXTRACTION W/PHACO Right 07/11/2020   Procedure: CATARACT EXTRACTION PHACO AND INTRAOCULAR LENS PLACEMENT (IOC);  Surgeon: Baruch Goldmann, MD;  Location: [facility];  Service: Ophthalmology;  Laterality: Right;  CDE: 20.43  . CYSTOSCOPY W/ URETERAL STENT PLACEMENT Left 09/23/2017   Procedure: CYSTOSCOPY WITH RETROGRADE PYELOGRAM/URETERAL LEFT STENT PLACEMENT;  Surgeon: Ceasar Mons, MD;  Location: [facility];  Service: Urology;  Laterality: Left;  . CYSTOSCOPY/URETEROSCOPY/HOLMIUM LASER/STENT PLACEMENT Left 10/02/2017   Procedure: CYSTOSCOPY/URETEROSCOPY/HOLMIUM LASER/STENT PLACEMENT, STONE BASKETRY;  Surgeon: Ceasar Mons, MD;  Location: [facility];  Service: Urology;  Laterality: Left;  ONLY NEEDS 45 MIN FOR PROCEDURE  . EXTRACORPOREAL SHOCK WAVE LITHOTRIPSY  2002  . TRANSTHORACIC ECHOCARDIOGRAM  05/31/2011   dr s. Domenic Polite   mild concentric LVH, ef 60-65%/  moderately calcified AV w/ mild AV stenosis, no regurg. (mean grandient , peak gradient 70mmHg)/ trivial TR       Family History  Problem Relation Age of Onset  . Cancer Father        Bone cancer  . Thyroid disease Neg Hx     Social  History   Tobacco Use  . Smoking status: Never Smoker  . Smokeless tobacco: Current User    Types: Chew  . Tobacco comment: chew tobacco for 30 years (as of 09-30-2017)  Vaping Use  . Vaping Use: Never used  Substance Use Topics  . Alcohol use: No  . Drug use: No    Home Medications Prior to Admission medications   Medication Sig Start Date End Date Taking? Authorizing Provider  amLODipine (NORVASC) 5 MG tablet Take 5 mg by mouth daily. 05/10/18   [provider]  amoxicillin (AMOXIL) 500 MG capsule Take 2 capsules (1,000 mg total) by mouth 3 (three) times daily. 09/14/20   Shanon Ace, PA-C  aspirin EC 81 MG tablet Take 81 mg by mouth daily.    [provider]  atorvastatin (LIPITOR) 40 MG tablet Take 40 mg by mouth daily. 04/12/18   [provider]  azithromycin (ZITHROMAX Z-PAK) 250 MG tablet Take 2 tablets (500 mg total) by mouth daily for 1 day, THEN 1 tablet (250 mg total) daily for 4 days. 09/15/20 09/20/20  Shanon Ace, PA-C  multivitamin (ONE-A-DAY MEN'S) TABS tablet Take 1 tablet by mouth daily.    [provider]  PRESCRIPTION MEDICATION Place 1 drop into the left eye in the morning, at noon, and at bedtime. Prednisolone, Bromfenac and Moxifloxacin Drop    [provider]  tamsulosin (FLOMAX) 0.4 MG CAPS capsule Take 0.4 mg by mouth daily. 05/30/18   [provider]    Allergies    Patient has no known allergies.  Review of Systems   Review of Systems All other systems are reviewed and are negative for acute change except as noted in the HPI.  Physical Exam Updated Vital Signs BP 138/89   Pulse 98   Temp 98.6 F (37 C) (Oral)   Resp 16   Ht 5\' 8"  (1.727 m)   Wt 72.6 kg   SpO2 94%   BMI 24.33 kg/m   Physical Exam Vitals and nursing note reviewed.  Constitutional:      General: He is not in acute distress.    Appearance: He is not ill-appearing.  HENT:     Head: Normocephalic and  atraumatic.     Right Ear: Tympanic membrane and external ear normal.     Left Ear: Tympanic membrane and external ear normal.     Nose: Nose normal.     Mouth/Throat:     Mouth: Mucous membranes are moist.     Pharynx: Oropharynx is clear.  Eyes:     General: No scleral icterus.       Right eye: No discharge.        Left eye: No discharge.     Extraocular Movements: Extraocular movements intact.     Conjunctiva/sclera: Conjunctivae normal.     Pupils: Pupils are equal, round, and reactive to light.  Neck:     Vascular: No JVD.  Cardiovascular:     Rate and Rhythm: Normal rate and regular rhythm.     Pulses: Normal pulses.          Radial pulses are 2+ on the right side and  2+ on the left side.     Heart sounds: Normal heart sounds.  Pulmonary:     Comments: Rales and rhonchi heard in bilateral lung bases.  Symmetric chest rise. No wheezing.  Oxygen saturation ranging from 92-94% on room air during exam. Abdominal:     Comments: Abdomen is soft, non-distended, and non-tender in all quadrants. No rigidity, no guarding. No peritoneal signs.  Musculoskeletal:        General: Normal range of motion.     Cervical back: Normal range of motion.  Skin:    General: Skin is warm and dry.     Capillary Refill: Capillary refill takes less than 2 seconds.  Neurological:     Mental Status: He is oriented to person, place, and time.     GCS: GCS eye subscore is 4. GCS verbal subscore is 5. GCS motor subscore is 6.     Comments: Fluent speech, no facial droop.  Psychiatric:        Behavior: Behavior normal.     ED Results / Procedures / Treatments   Labs (all labs ordered are listed, but only abnormal results are displayed) Labs Reviewed  CBC WITH DIFFERENTIAL/PLATELET - Abnormal; Notable for the following components:      Result Value   WBC 16.0 (*)    Neutro Abs 12.1 (*)    Monocytes Absolute 1.1 (*)    All other components within normal limits  COMPREHENSIVE METABOLIC PANEL -  Abnormal; Notable for the following components:   Glucose, Bld 150 (*)    Calcium 8.5 (*)    Total Bilirubin 2.3 (*)    All other components within normal limits  RESP PANEL BY RT-PCR (FLU A&B, COVID) [id number]    EKG EKG Interpretation  Date/Time:  Wednesday September 14 2020 14:51:27 EST Ventricular Rate:  85 PR Interval:  186 QRS Duration: 80 QT Interval:  348 QTC Calculation: 414 R Axis:   30 Text Interpretation: Normal sinus rhythm Normal ECG No old tracing to compare Confirmed by Calvert Cantor [phone]) on 09/14/2020 3:01:19 PM   Radiology DG Chest Portable 1 View  Result Date: 09/14/2020 CLINICAL DATA:  Cough.  Hypoxia. EXAM: PORTABLE CHEST 1 VIEW COMPARISON:  Chest x-ray dated August 18, 2008. FINDINGS: The heart size and mediastinal contours are within normal limits. Diffuse interstitial thickening. No focal consolidation, pleural effusion, or pneumothorax. Chronic markedly elevated right hemidiaphragm. No acute osseous abnormality. IMPRESSION: 1. Diffuse interstitial thickening may reflect pulmonary edema or atypical infection. Electronically Signed   By: Titus Dubin M.D.   On: 09/14/2020 13:40    Procedures Procedures (including critical care time)  Medications Ordered in ED Medications  azithromycin (ZITHROMAX) 500 mg in sodium chloride 0.9 % 250 mL IVPB (500 mg Intravenous New Bag/Given 09/14/20 1529)  cefTRIAXone (ROCEPHIN) 1 g in sodium chloride 0.9 % 100 mL IVPB (0 g Intravenous Stopped 09/14/20 1530)    ED Course  I have reviewed the triage vital signs and the nursing notes.  Pertinent labs & imaging results that were available during my care of the patient were reviewed by me and considered in my medical decision making (see chart for details).    MDM Rules/Calculators/A&P                          History provided by patient with additional history obtained from chart review.    86 year old male presenting with generalized body aches and hypoxia  from PCP office.  On ED arrival he is afebrile, HDS.  Oxygen saturation was 91% on room air. Non toxic appearing, looks very well.  On exam rales and rhonchi heard in bilateral lung bases.  No abdominal tenderness. He ambulated with oxygen saturation staying at 94%.  Once he sat down oxygen range from 92 to 94%.  Covid and influenza tests are negative. CBC with leukocytosis 16. CMP overall unremarkable, no severe electrolyte derangement, no renal insufficiency.  I viewed patient's chest x-ray which shows chronically markedly elevated right hemidiaphragm and diffuse interstitial thickening which could be suggestive of atypical infection.  Patient given IV Rocephin and azithromycin for presumed CAP based on symptoms, imaging, and exam. Curb-65 score of 1 with point only for his age. He is ambulatory without hypoxia or tachycardia. He feels he can manage symptoms at home. Discharge to home with prescription for amoxicillin and azithromycin with incentive spirometer. The patient appears reasonably screened and/or stabilized for discharge and I doubt any other medical condition or other [facility] requiring further screening, evaluation, or treatment in the ED at this time prior to discharge. The patient is safe for discharge with strict return precautions discussed. Recommend pcp follow up in 2 days for symptom recheck. The patient was discussed with and seen by Dr. Kathrynn Humble who agrees with the treatment plan.   Portions of this note were generated with [employer]. Dictation errors may occur despite best attempts at proofreading.   Final Clinical Impression(s) / ED Diagnoses Final diagnoses:  Community acquired pneumonia, unspecified laterality    Rx / DC Orders ED Discharge Orders         Ordered    azithromycin (ZITHROMAX Z-PAK) 250 MG tablet  Status:  Discontinued        09/14/20 1543    azithromycin (ZITHROMAX Z-PAK) 250 MG tablet        09/14/20 1607    amoxicillin (AMOXIL) 500 MG  capsule  3 times daily,   Status:  Discontinued        09/14/20 1543    amoxicillin (AMOXIL) 500 MG capsule  3 times daily        09/14/20 1607           Barrie Folk, PA-C 09/14/20 1611    Barrie Folk, PA-C 09/14/20 Minto, Ankit, MD 09/14/20 1630

## 2020-09-14 NOTE — Discharge Instructions (Addendum)
Your work up today shows you have pneumonia.    Prescriptions sent to your pharmacy for 2 medications to treat your pneumonia.  Take both of them starting tomorrow as you already received a dose of antibiotics in the emergency department today.  -you can take tylenol for pain or fever as needed.  Follow up with your doctor in 2 days for symptom recheck.  Do your best to stay well hydrated and drink plenty of water.  Return to the emergency department if you have any new or worsening symptoms including shortness of breath or difficulty breathing.

## 2020-09-14 NOTE — ED Triage Notes (Signed)
Pt sent by Dr. Sherwood Gambler after being seen earlier due to low Oxygen level, "cold" for a week with cough, body and no appetite.

## 2020-09-20 DIAGNOSIS — J189 Pneumonia, unspecified organism: Secondary | ICD-10-CM | POA: Diagnosis not present

## 2020-09-20 DIAGNOSIS — E663 Overweight: Secondary | ICD-10-CM | POA: Diagnosis not present

## 2020-09-20 DIAGNOSIS — Z6825 Body mass index (BMI) 25.0-25.9, adult: Secondary | ICD-10-CM | POA: Diagnosis not present

## 2021-02-08 DIAGNOSIS — N401 Enlarged prostate with lower urinary tract symptoms: Secondary | ICD-10-CM | POA: Diagnosis not present

## 2021-02-08 DIAGNOSIS — Z13220 Encounter for screening for lipoid disorders: Secondary | ICD-10-CM | POA: Diagnosis not present

## 2021-02-08 DIAGNOSIS — Z125 Encounter for screening for malignant neoplasm of prostate: Secondary | ICD-10-CM | POA: Diagnosis not present

## 2021-02-08 DIAGNOSIS — I10 Essential (primary) hypertension: Secondary | ICD-10-CM | POA: Diagnosis not present

## 2021-02-08 DIAGNOSIS — Z79899 Other long term (current) drug therapy: Secondary | ICD-10-CM | POA: Diagnosis not present

## 2021-02-08 DIAGNOSIS — R634 Abnormal weight loss: Secondary | ICD-10-CM | POA: Diagnosis not present

## 2021-02-08 DIAGNOSIS — E0590 Thyrotoxicosis, unspecified without thyrotoxic crisis or storm: Secondary | ICD-10-CM | POA: Diagnosis not present

## 2021-02-08 DIAGNOSIS — Z681 Body mass index (BMI) 19 or less, adult: Secondary | ICD-10-CM | POA: Diagnosis not present

## 2021-02-08 DIAGNOSIS — E559 Vitamin D deficiency, unspecified: Secondary | ICD-10-CM | POA: Diagnosis not present

## 2021-02-08 DIAGNOSIS — N419 Inflammatory disease of prostate, unspecified: Secondary | ICD-10-CM | POA: Diagnosis not present

## 2021-02-08 DIAGNOSIS — N390 Urinary tract infection, site not specified: Secondary | ICD-10-CM | POA: Diagnosis not present

## 2021-02-20 DIAGNOSIS — N390 Urinary tract infection, site not specified: Secondary | ICD-10-CM | POA: Diagnosis not present

## 2021-03-03 DIAGNOSIS — Z0001 Encounter for general adult medical examination with abnormal findings: Secondary | ICD-10-CM | POA: Diagnosis not present

## 2021-03-03 DIAGNOSIS — Z1331 Encounter for screening for depression: Secondary | ICD-10-CM | POA: Diagnosis not present

## 2021-03-03 DIAGNOSIS — Z6823 Body mass index (BMI) 23.0-23.9, adult: Secondary | ICD-10-CM | POA: Diagnosis not present

## 2021-03-03 DIAGNOSIS — D692 Other nonthrombocytopenic purpura: Secondary | ICD-10-CM | POA: Diagnosis not present

## 2021-03-03 DIAGNOSIS — I358 Other nonrheumatic aortic valve disorders: Secondary | ICD-10-CM | POA: Diagnosis not present

## 2021-03-03 DIAGNOSIS — E782 Mixed hyperlipidemia: Secondary | ICD-10-CM | POA: Diagnosis not present

## 2021-03-03 DIAGNOSIS — E039 Hypothyroidism, unspecified: Secondary | ICD-10-CM | POA: Diagnosis not present

## 2021-03-03 DIAGNOSIS — Z125 Encounter for screening for malignant neoplasm of prostate: Secondary | ICD-10-CM | POA: Diagnosis not present

## 2021-03-03 DIAGNOSIS — I10 Essential (primary) hypertension: Secondary | ICD-10-CM | POA: Diagnosis not present

## 2021-03-03 DIAGNOSIS — N400 Enlarged prostate without lower urinary tract symptoms: Secondary | ICD-10-CM | POA: Diagnosis not present

## 2021-03-03 DIAGNOSIS — Z1389 Encounter for screening for other disorder: Secondary | ICD-10-CM | POA: Diagnosis not present

## 2021-03-16 DIAGNOSIS — H02055 Trichiasis without entropian left lower eyelid: Secondary | ICD-10-CM | POA: Diagnosis not present

## 2021-03-30 DIAGNOSIS — R351 Nocturia: Secondary | ICD-10-CM | POA: Diagnosis not present

## 2021-03-30 DIAGNOSIS — N401 Enlarged prostate with lower urinary tract symptoms: Secondary | ICD-10-CM | POA: Diagnosis not present

## 2021-03-30 DIAGNOSIS — R8279 Other abnormal findings on microbiological examination of urine: Secondary | ICD-10-CM | POA: Diagnosis not present

## 2021-03-30 DIAGNOSIS — N200 Calculus of kidney: Secondary | ICD-10-CM | POA: Diagnosis not present

## 2021-04-12 DIAGNOSIS — D72829 Elevated white blood cell count, unspecified: Secondary | ICD-10-CM | POA: Diagnosis not present

## 2021-05-20 ENCOUNTER — Emergency Department (HOSPITAL_COMMUNITY): Payer: Medicare HMO

## 2021-05-20 ENCOUNTER — Inpatient Hospital Stay (HOSPITAL_COMMUNITY)
Admission: EM | Admit: 2021-05-20 | Discharge: 2021-05-24 | DRG: 377 | Disposition: A | Discharge status: Home / Self Care | Payer: Medicare HMO | Attending: Internal Medicine | Admitting: Internal Medicine

## 2021-05-20 ENCOUNTER — Encounter (HOSPITAL_COMMUNITY): Payer: Self-pay | Admitting: Emergency Medicine

## 2021-05-20 ENCOUNTER — Other Ambulatory Visit: Payer: Self-pay

## 2021-05-20 DIAGNOSIS — R110 Nausea: Secondary | ICD-10-CM | POA: Diagnosis not present

## 2021-05-20 DIAGNOSIS — R58 Hemorrhage, not elsewhere classified: Secondary | ICD-10-CM | POA: Diagnosis not present

## 2021-05-20 DIAGNOSIS — K635 Polyp of colon: Secondary | ICD-10-CM | POA: Diagnosis present

## 2021-05-20 DIAGNOSIS — K5909 Other constipation: Secondary | ICD-10-CM | POA: Diagnosis present

## 2021-05-20 DIAGNOSIS — Z974 Presence of external hearing-aid: Secondary | ICD-10-CM

## 2021-05-20 DIAGNOSIS — K922 Gastrointestinal hemorrhage, unspecified: Secondary | ICD-10-CM | POA: Diagnosis present

## 2021-05-20 DIAGNOSIS — R0602 Shortness of breath: Secondary | ICD-10-CM | POA: Diagnosis not present

## 2021-05-20 DIAGNOSIS — D62 Acute posthemorrhagic anemia: Secondary | ICD-10-CM | POA: Diagnosis present

## 2021-05-20 DIAGNOSIS — H9190 Unspecified hearing loss, unspecified ear: Secondary | ICD-10-CM | POA: Diagnosis present

## 2021-05-20 DIAGNOSIS — E0590 Thyrotoxicosis, unspecified without thyrotoxic crisis or storm: Secondary | ICD-10-CM | POA: Diagnosis present

## 2021-05-20 DIAGNOSIS — I2510 Atherosclerotic heart disease of native coronary artery without angina pectoris: Secondary | ICD-10-CM | POA: Diagnosis present

## 2021-05-20 DIAGNOSIS — K625 Hemorrhage of anus and rectum: Secondary | ICD-10-CM | POA: Diagnosis not present

## 2021-05-20 DIAGNOSIS — R42 Dizziness and giddiness: Secondary | ICD-10-CM | POA: Diagnosis not present

## 2021-05-20 DIAGNOSIS — K5731 Diverticulosis of large intestine without perforation or abscess with bleeding: Principal | ICD-10-CM | POA: Diagnosis present

## 2021-05-20 DIAGNOSIS — U071 COVID-19: Secondary | ICD-10-CM | POA: Diagnosis not present

## 2021-05-20 DIAGNOSIS — K921 Melena: Secondary | ICD-10-CM

## 2021-05-20 DIAGNOSIS — I10 Essential (primary) hypertension: Secondary | ICD-10-CM | POA: Diagnosis present

## 2021-05-20 DIAGNOSIS — K640 First degree hemorrhoids: Secondary | ICD-10-CM | POA: Diagnosis present

## 2021-05-20 DIAGNOSIS — N400 Enlarged prostate without lower urinary tract symptoms: Secondary | ICD-10-CM | POA: Diagnosis present

## 2021-05-20 DIAGNOSIS — M1990 Unspecified osteoarthritis, unspecified site: Secondary | ICD-10-CM | POA: Diagnosis present

## 2021-05-20 DIAGNOSIS — E782 Mixed hyperlipidemia: Secondary | ICD-10-CM | POA: Diagnosis present

## 2021-05-20 DIAGNOSIS — Z7982 Long term (current) use of aspirin: Secondary | ICD-10-CM

## 2021-05-20 DIAGNOSIS — Z87442 Personal history of urinary calculi: Secondary | ICD-10-CM

## 2021-05-20 DIAGNOSIS — R0902 Hypoxemia: Secondary | ICD-10-CM | POA: Diagnosis not present

## 2021-05-20 DIAGNOSIS — K5730 Diverticulosis of large intestine without perforation or abscess without bleeding: Secondary | ICD-10-CM | POA: Diagnosis not present

## 2021-05-20 DIAGNOSIS — Z720 Tobacco use: Secondary | ICD-10-CM

## 2021-05-20 DIAGNOSIS — Z743 Need for continuous supervision: Secondary | ICD-10-CM | POA: Diagnosis not present

## 2021-05-20 DIAGNOSIS — Z79899 Other long term (current) drug therapy: Secondary | ICD-10-CM

## 2021-05-20 LAB — COMPREHENSIVE METABOLIC PANEL
ALT: 27 U/L (ref 0–44)
AST: 29 U/L (ref 15–41)
Albumin: 3.9 g/dL (ref 3.5–5.0)
Alkaline Phosphatase: 73 U/L (ref 38–126)
Anion gap: 6 (ref 5–15)
BUN: 23 mg/dL (ref 8–23)
CO2: 25 mmol/L (ref 22–32)
Calcium: 8.3 mg/dL — ABNORMAL LOW (ref 8.9–10.3)
Chloride: 109 mmol/L (ref 98–111)
Creatinine, Ser: 0.96 mg/dL (ref 0.61–1.24)
GFR, Estimated: >60 mL/min (ref >60)
Glucose, Bld: 108 mg/dL — ABNORMAL HIGH (ref 70–99)
Potassium: 3.8 mmol/L (ref 3.5–5.1)
Sodium: 140 mmol/L (ref 135–145)
Total Bilirubin: 1 mg/dL (ref 0.3–1.2)
Total Protein: 7.3 g/dL (ref 6.5–8.1)

## 2021-05-20 LAB — CBC
HCT: 39 % (ref 39.0–52.0)
Hemoglobin: 12.8 g/dL — ABNORMAL LOW (ref 13.0–17.0)
MCH: 31.2 pg (ref 26.0–34.0)
MCHC: 32.8 g/dL (ref 30.0–36.0)
MCV: 95.1 fL (ref 80.0–100.0)
Platelets: 337 10*3/uL (ref 150–400)
RBC: 4.1 MIL/uL — ABNORMAL LOW (ref 4.22–5.81)
RDW: 13.2 % (ref 11.5–15.5)
WBC: 9 10*3/uL (ref 4.0–10.5)
nRBC: 0 % (ref 0.0–0.2)

## 2021-05-20 LAB — PROTIME-INR
INR: 1.1 (ref 0.8–1.2)
Prothrombin Time: 14.7 seconds (ref 11.4–15.2)

## 2021-05-20 MED ORDER — SODIUM CHLORIDE 0.9 % IV SOLN: INTRAVENOUS | Status: DC

## 2021-05-20 MED ORDER — PANTOPRAZOLE 80MG IVPB - SIMPLE MED
80.0000 mg | Freq: Once | INTRAVENOUS | Status: AC
  Administered 2021-05-20: 80 mg via INTRAVENOUS
  Filled 2021-05-20: qty 100

## 2021-05-20 MED ORDER — ONDANSETRON HCL 4 MG/2ML IJ SOLN
4.0000 mg | Freq: Once | INTRAMUSCULAR | Status: AC
  Administered 2021-05-20: 4 mg via INTRAVENOUS
  Filled 2021-05-20: qty 2

## 2021-05-20 MED ORDER — SODIUM CHLORIDE 0.9 % IV BOLUS
1000.0000 mL | Freq: Once | INTRAVENOUS | Status: AC
  Administered 2021-05-20: 1000 mL via INTRAVENOUS

## 2021-05-20 MED ORDER — PANTOPRAZOLE INFUSION (NEW) - SIMPLE MED
8.0000 mg/h | INTRAVENOUS | Status: DC
  Administered 2021-05-20 – 2021-05-21 (×2): 8 mg/h via INTRAVENOUS
  Filled 2021-05-20: qty 100
  Filled 2021-05-20: qty 80

## 2021-05-20 NOTE — ED Provider Notes (Signed)
[facility] EMERGENCY DEPARTMENT Provider Note   CSN: [id number] Arrival date & time: 05/20/21  2228     History Chief Complaint  Patient presents with   Rectal Bleeding    Jaime [name] is a 87 y.o. male.  Patient with rectal bleeding onset this evening. Reports last BM 2 days ago and was normal. Had bright red blood per rectum x2 at home and x2 here.  Denies having abdominal pain or rectal pain.  States he had blood in the toilet bowl without any stool.  He does not take any blood thinners.  No nausea or vomiting.  Does have some shortness of breath and dizziness onset this evening.  Takes aspirin but no other blood thinners.  Reports never have bleeding in the stool before.  States sudden onset rectal bleeding with 2 episodes at home and 2 episodes here.  Is not seeing any stool.  Reports left stool several days ago was not bloody or black.  The history is provided by the patient.  Rectal Bleeding Associated symptoms: dizziness and light-headedness   Associated symptoms: no abdominal pain, no fever and no vomiting       Past Medical History:  Diagnosis Date   BPH (benign prostatic hyperplasia)    Coronary atherosclerosis of native coronary artery    Minimal coronary atherosclerosis at catheterization 2003   Diverticulosis of colon    Exertional dyspnea    History of colonic polyps    History of exercise stress test 05-31-2011   dr Lattie Haw   adequate but abnormal graded exercise test revealing reasonably good exercise capacity, no chest discomfort or other ischemic symptoms, borderline hypertensive blood pressure response and an ecg response consistent with myocardial ishcemia , but with very rapid resolution after the cessation of exercise suggesting a possible false positive ecg response    History of kidney stones    Left ureteral stone    Mixed hyperlipidemia    Nephrolithiasis    Osteoarthritis    Wears glasses    Wears hearing aid in both ears    Wears partial  dentures    upper only    Patient Active Problem List   Diagnosis Date Noted   Hyperthyroidism 07/18/2019   Difficulty in walking(719.7) 03/26/2012   Elevated bilirubin 03/25/2012   Shortness of breath 05/16/2011   Undiagnosed cardiac murmurs 05/16/2011   Dizziness and giddiness 05/16/2011   Mixed hyperlipidemia 05/16/2011   Coronary atherosclerosis of native coronary artery 05/16/2011    Past Surgical History:  Procedure Laterality Date   CARDIAC CATHETERIZATION  02-27-2002  dr hochrein   minimal coronary plaquing , normal lvf   CATARACT EXTRACTION W/PHACO Left 06/27/2020   Procedure: CATARACT EXTRACTION PHACO AND INTRAOCULAR LENS PLACEMENT LEFT EYE;  Surgeon: Baruch Goldmann, MD;  Location: [facility];  Service: Ophthalmology;  Laterality: Left;  CDE:16.96   CATARACT EXTRACTION W/PHACO Right 07/11/2020   Procedure: CATARACT EXTRACTION PHACO AND INTRAOCULAR LENS PLACEMENT (IOC);  Surgeon: Baruch Goldmann, MD;  Location: [facility];  Service: Ophthalmology;  Laterality: Right;  CDE: 20.43   CYSTOSCOPY W/ URETERAL STENT PLACEMENT Left 09/23/2017   Procedure: CYSTOSCOPY WITH RETROGRADE PYELOGRAM/URETERAL LEFT STENT PLACEMENT;  Surgeon: Ceasar Mons, MD;  Location: [facility];  Service: Urology;  Laterality: Left;   CYSTOSCOPY/URETEROSCOPY/HOLMIUM LASER/STENT PLACEMENT Left 10/02/2017   Procedure: CYSTOSCOPY/URETEROSCOPY/HOLMIUM LASER/STENT PLACEMENT, STONE BASKETRY;  Surgeon: Ceasar Mons, MD;  Location: [facility];  Service: Urology;  Laterality: Left;  ONLY NEEDS 45 MIN FOR PROCEDURE   EXTRACORPOREAL  SHOCK WAVE LITHOTRIPSY  2002   TRANSTHORACIC ECHOCARDIOGRAM  05/31/2011   dr s. Domenic Polite   mild concentric LVH, ef 60-65%/  moderately calcified AV w/ mild AV stenosis, no regurg. (mean grandient 67mHg, peak gradient 162mg)/ trivial TR       Family History  Problem Relation Age of Onset   Cancer Father        Bone cancer   Thyroid disease Neg Hx      Social History   Tobacco Use   Smoking status: Never   Smokeless tobacco: Current    Types: Chew   Tobacco comments:    chew tobacco for 30 years (as of 09-30-2017)  Vaping Use   Vaping Use: Never used  Substance Use Topics   Alcohol use: No   Drug use: No    Home Medications Prior to Admission medications   Medication Sig Start Date End Date Taking? Authorizing Provider  amLODipine (NORVASC) 5 MG tablet Take 5 mg by mouth daily. 05/10/18   [provider]  amoxicillin (AMOXIL) 500 MG capsule Take 2 capsules (1,000 mg total) by mouth 3 (three) times daily. 09/14/20   WaBarrie FolkPA-C  aspirin EC 81 MG tablet Take 81 mg by mouth daily.    [provider]  atorvastatin (LIPITOR) 40 MG tablet Take 40 mg by mouth daily. 04/12/18   [provider]  multivitamin (ONE-A-DAY MEN'S) TABS tablet Take 1 tablet by mouth daily.    [provider]  PRESCRIPTION MEDICATION Place 1 drop into the left eye in the morning, at noon, and at bedtime. Prednisolone, Bromfenac and Moxifloxacin Drop    [provider]  tamsulosin (FLOMAX) 0.4 MG CAPS capsule Take 0.4 mg by mouth daily. 05/30/18   [provider]    Allergies    Patient has no known allergies.  Review of Systems   Review of Systems  Constitutional:  Positive for activity change, appetite change and fatigue. Negative for fever.  HENT:  Negative for congestion.   Respiratory:  Negative for cough, chest tightness and shortness of breath.   Cardiovascular:  Negative for chest pain.  Gastrointestinal:  Positive for anal bleeding, blood in stool, hematochezia and nausea. Negative for abdominal pain and vomiting.  Genitourinary:  Negative for dysuria and hematuria.  Musculoskeletal:  Negative for arthralgias and joint swelling.  Neurological:  Positive for dizziness, weakness and light-headedness. Negative for headaches.   all other systems are negative except as noted in the  HPI and PMH.   Physical Exam Updated Vital Signs BP (!) 155/78 (BP Location: Right Arm)   Pulse 77   Temp 97.8 F (36.6 C) (Oral)   Resp 18   Ht '5\' 8"'$  (1.727 m)   Wt 68.9 kg   SpO2 95%   BMI 23.11 kg/m   Physical Exam Vitals and nursing note reviewed.  Constitutional:      General: He is not in acute distress.    Appearance: He is well-developed.  HENT:     Head: Normocephalic and atraumatic.     Mouth/Throat:     Pharynx: No oropharyngeal exudate.  Eyes:     Conjunctiva/sclera: Conjunctivae normal.     Pupils: Pupils are equal, round, and reactive to light.  Neck:     Comments: No meningismus. Cardiovascular:     Rate and Rhythm: Normal rate and regular rhythm.     Heart sounds: Normal heart sounds. No murmur heard. Pulmonary:     Effort: Pulmonary effort is normal. No  respiratory distress.     Breath sounds: Normal breath sounds.  Abdominal:     Palpations: Abdomen is soft.     Tenderness: There is no abdominal tenderness. There is no guarding or rebound.  Genitourinary:    Comments: Dark blood on rectal exam, no hemorrhoids or fissures Musculoskeletal:        General: No tenderness. Normal range of motion.     Cervical back: Normal range of motion and neck supple.  Skin:    General: Skin is warm.  Neurological:     Mental Status: He is alert and oriented to person, place, and time.     Cranial Nerves: No cranial nerve deficit.     Motor: No abnormal muscle tone.     Coordination: Coordination normal.     Comments: No ataxia on finger to nose bilaterally. No pronator drift. 5/5 strength throughout. CN 2-12 intact.Equal grip strength. Sensation intact.   Psychiatric:        Behavior: Behavior normal.    ED Results / Procedures / Treatments   Labs (all labs ordered are listed, but only abnormal results are displayed) Labs Reviewed  RESP PANEL BY RT-PCR (FLU A&B, COVID) ARPGX2 - Abnormal; Notable for the following components:      Result Value   SARS  Coronavirus 2 by RT PCR POSITIVE (*)    All other components within normal limits  COMPREHENSIVE METABOLIC PANEL - Abnormal; Notable for the following components:   Glucose, Bld 108 (*)    Calcium 8.3 (*)    All other components within normal limits  CBC - Abnormal; Notable for the following components:   RBC 4.10 (*)    Hemoglobin 12.8 (*)    All other components within normal limits  HEMOGLOBIN - Abnormal; Notable for the following components:   Hemoglobin 9.4 (*)    All other components within normal limits  BASIC METABOLIC PANEL - Abnormal; Notable for the following components:   Chloride 113 (*)    Glucose, Bld 105 (*)    Calcium 7.7 (*)    All other components within normal limits  POC OCCULT BLOOD, ED - Abnormal; Notable for the following components:   Fecal Occult Bld POSITIVE (*)    All other components within normal limits  PROTIME-INR  D-DIMER, QUANTITATIVE  HEMOGLOBIN  HEMOGLOBIN  FERRITIN  C-REACTIVE PROTEIN  PROCALCITONIN  POC OCCULT BLOOD, ED  TYPE AND SCREEN  ABO/RH  PREPARE RBC (CROSSMATCH)    EKG None  Radiology DG Chest Port 1 View  Result Date: 05/20/2021 CLINICAL DATA:  Shortness of breath EXAM: PORTABLE CHEST 1 VIEW COMPARISON:  09/14/2020 FINDINGS: Stable chronic elevation of the right hemidiaphragm. Chronic changes in the lungs with scarring. Heart is normal size. No acute opacities or effusions. IMPRESSION: Stable chronic elevation of the right hemidiaphragm. Stable chronic interstitial prominence, likely chronic lung disease/scarring. Electronically Signed   By: Rolm Baptise M.D.   On: 05/20/2021 23:40   CT Angio Abd/Pel W and/or Wo Contrast  Result Date: 05/21/2021 CLINICAL DATA:  Rectal bleeding EXAM: CTA ABDOMEN AND PELVIS WITHOUT AND WITH CONTRAST TECHNIQUE: Multidetector CT imaging of the abdomen and pelvis was performed using the standard protocol during bolus administration of intravenous contrast. Multiplanar reconstructed images and MIPs  were obtained and reviewed to evaluate the vascular anatomy. CONTRAST:  170m OMNIPAQUE IOHEXOL 350 MG/ML SOLN COMPARISON:  09/21/2017 FINDINGS: VASCULAR Aorta: Heavily calcified aorta.  No aneurysm.  No dissection. Celiac: Patent without evidence of aneurysm, dissection, vasculitis or significant  stenosis. SMA: Patent without evidence of aneurysm, dissection, vasculitis or significant stenosis. Renals: Both renal arteries are patent without evidence of aneurysm, dissection, vasculitis, fibromuscular dysplasia or significant stenosis. IMA: Patent without evidence of aneurysm, dissection, vasculitis or significant stenosis. Inflow: Patent without evidence of aneurysm, dissection, vasculitis or significant stenosis. Proximal Outflow: Bilateral common femoral and visualized portions of the superficial and profunda femoral arteries are patent without evidence of aneurysm, dissection, vasculitis or significant stenosis. Veins: No obvious venous abnormality within the limitations of this arterial phase study. Review of the MIP images confirms the above findings. NON-VASCULAR Lower chest: No acute abnormality Hepatobiliary: No focal hepatic abnormality. Gallbladder unremarkable. Pancreas: No focal abnormality or ductal dilatation. Spleen: No focal abnormality.  Normal size. Adrenals/Urinary Tract: Small cysts in the kidneys bilaterally. No stones or hydronephrosis. Adrenal glands and urinary bladder unremarkable. Stomach/Bowel: Diffuse colonic diverticulosis. There is contrast seen within the proximal sigmoid colon, likely active bleeding from a diverticulum. No bowel obstruction. Stomach and small bowel decompressed, grossly unremarkable. Lymphatic: No adenopathy Reproductive: Prostate enlargement Other: No free fluid or free air. Musculoskeletal: No acute bony abnormality. IMPRESSION: VASCULAR Contrast extravasation noted in the proximal sigmoid colon, likely source of GI bleed from a diverticulum. Aortic  atherosclerosis. NON-VASCULAR Diffuse colonic diverticulosis.  No active diverticulitis. Prostate enlargement. Electronically Signed   By: Rolm Baptise M.D.   On: 05/21/2021 01:55    Procedures .Critical Care  Date/Time: 05/21/2021 2:17 AM Performed by: Ezequiel Essex, MD Authorized by: Ezequiel Essex, MD   Critical care provider statement:    Critical care time (minutes):  45   Critical care was time spent personally by me on the following activities:  Discussions with consultants, evaluation of patient's response to treatment, examination of patient, ordering and performing treatments and interventions, ordering and review of laboratory studies, ordering and review of radiographic studies, pulse oximetry, re-evaluation of patient's condition, obtaining history from patient or surrogate and review of old charts (GI bleeding)   Medications Ordered in ED Medications  sodium chloride 0.9 % bolus 1,000 mL (has no administration in time range)    And  0.9 %  sodium chloride infusion (has no administration in time range)  pantoprazole (PROTONIX) 80 mg /NS 100 mL IVPB (has no administration in time range)  pantoprozole (PROTONIX) 80 mg /NS 100 mL infusion (has no administration in time range)  ondansetron (ZOFRAN) injection 4 mg (has no administration in time range)    ED Course  I have reviewed the triage vital signs and the nursing notes.  Pertinent labs & imaging results that were available during my care of the patient were reviewed by me and considered in my medical decision making (see chart for details).    MDM Rules/Calculators/A&P                           Painless rectal bleeding x4 since this evening.  No blood thinner use.  Vitals are stable.  Abdomen is soft and nontender  Hemoglobin stable at 12.8  Fluids and IV Protonix begun.  CTA is positive for diverticular bleeding.  Discussed with Dr. Kathlene Cote of radiology.  He agrees no need for emergent intervention tonight  given patient's stability.  Does recommend transfer to [facility] for closer observation.  Blood pressure and mental status remained stable throughout ED course.  Patient and family updated.  Admission discussed with Dr. Olevia Bowens Blood pressure mental status remained stable throughout ED course.  Patient incidentally COVID-positive.  No hypoxia or increased work of breathing. Final Clinical Impression(s) / ED Diagnoses Final diagnoses:  SOB (shortness of breath)  Hematochezia    Rx / DC Orders ED Discharge Orders     None        Giovan Pinsky, Annie Main, MD 05/21/21 [phone]

## 2021-05-20 NOTE — ED Triage Notes (Signed)
  Patient BIB [facility] for rectal bleeding.  Patient states he went to eat dinner and used the restroom around 2100 noticing large amounts of blood in his stool.  Patient not on blood thinners and has never had a GI bleed before.  Patient states he has no pain and has felt fine all day, but was concerned about the amount of blood in the toilet.

## 2021-05-21 ENCOUNTER — Other Ambulatory Visit (HOSPITAL_COMMUNITY): Payer: Medicare HMO

## 2021-05-21 ENCOUNTER — Encounter (HOSPITAL_COMMUNITY): Payer: Self-pay | Admitting: Internal Medicine

## 2021-05-21 ENCOUNTER — Emergency Department (HOSPITAL_COMMUNITY): Payer: Medicare HMO

## 2021-05-21 ENCOUNTER — Inpatient Hospital Stay (HOSPITAL_COMMUNITY): Payer: Medicare HMO

## 2021-05-21 DIAGNOSIS — Z7982 Long term (current) use of aspirin: Secondary | ICD-10-CM | POA: Diagnosis not present

## 2021-05-21 DIAGNOSIS — U071 COVID-19: Secondary | ICD-10-CM | POA: Diagnosis not present

## 2021-05-21 DIAGNOSIS — Z720 Tobacco use: Secondary | ICD-10-CM | POA: Diagnosis not present

## 2021-05-21 DIAGNOSIS — K635 Polyp of colon: Secondary | ICD-10-CM | POA: Diagnosis not present

## 2021-05-21 DIAGNOSIS — K921 Melena: Secondary | ICD-10-CM | POA: Diagnosis not present

## 2021-05-21 DIAGNOSIS — D62 Acute posthemorrhagic anemia: Secondary | ICD-10-CM | POA: Diagnosis not present

## 2021-05-21 DIAGNOSIS — K5730 Diverticulosis of large intestine without perforation or abscess without bleeding: Secondary | ICD-10-CM | POA: Diagnosis not present

## 2021-05-21 DIAGNOSIS — K625 Hemorrhage of anus and rectum: Secondary | ICD-10-CM | POA: Diagnosis not present

## 2021-05-21 DIAGNOSIS — I2510 Atherosclerotic heart disease of native coronary artery without angina pectoris: Secondary | ICD-10-CM | POA: Diagnosis not present

## 2021-05-21 DIAGNOSIS — H9190 Unspecified hearing loss, unspecified ear: Secondary | ICD-10-CM | POA: Diagnosis not present

## 2021-05-21 DIAGNOSIS — K5731 Diverticulosis of large intestine without perforation or abscess with bleeding: Secondary | ICD-10-CM | POA: Diagnosis not present

## 2021-05-21 DIAGNOSIS — K5909 Other constipation: Secondary | ICD-10-CM | POA: Diagnosis not present

## 2021-05-21 DIAGNOSIS — E0590 Thyrotoxicosis, unspecified without thyrotoxic crisis or storm: Secondary | ICD-10-CM | POA: Diagnosis not present

## 2021-05-21 DIAGNOSIS — M1990 Unspecified osteoarthritis, unspecified site: Secondary | ICD-10-CM | POA: Diagnosis not present

## 2021-05-21 DIAGNOSIS — E782 Mixed hyperlipidemia: Secondary | ICD-10-CM | POA: Diagnosis not present

## 2021-05-21 DIAGNOSIS — Z974 Presence of external hearing-aid: Secondary | ICD-10-CM | POA: Diagnosis not present

## 2021-05-21 DIAGNOSIS — I10 Essential (primary) hypertension: Secondary | ICD-10-CM | POA: Diagnosis not present

## 2021-05-21 DIAGNOSIS — K640 First degree hemorrhoids: Secondary | ICD-10-CM | POA: Diagnosis not present

## 2021-05-21 DIAGNOSIS — K922 Gastrointestinal hemorrhage, unspecified: Secondary | ICD-10-CM | POA: Diagnosis not present

## 2021-05-21 DIAGNOSIS — N400 Enlarged prostate without lower urinary tract symptoms: Secondary | ICD-10-CM | POA: Diagnosis not present

## 2021-05-21 DIAGNOSIS — Z79899 Other long term (current) drug therapy: Secondary | ICD-10-CM | POA: Diagnosis not present

## 2021-05-21 DIAGNOSIS — Z87442 Personal history of urinary calculi: Secondary | ICD-10-CM | POA: Diagnosis not present

## 2021-05-21 HISTORY — PX: IR ANGIOGRAM VISCERAL SELECTIVE: IMG657

## 2021-05-21 HISTORY — PX: IR US GUIDE VASC ACCESS RIGHT: IMG2390

## 2021-05-21 LAB — FERRITIN: Ferritin: 129 ng/mL (ref 24–336)

## 2021-05-21 LAB — ABO/RH: ABO/RH(D): A POS

## 2021-05-21 LAB — CBC
HCT: 26.7 % — ABNORMAL LOW (ref 39.0–52.0)
Hemoglobin: 8.7 g/dL — ABNORMAL LOW (ref 13.0–17.0)
MCH: 31.2 pg (ref 26.0–34.0)
MCHC: 32.6 g/dL (ref 30.0–36.0)
MCV: 95.7 fL (ref 80.0–100.0)
Platelets: 264 10*3/uL (ref 150–400)
RBC: 2.79 MIL/uL — ABNORMAL LOW (ref 4.22–5.81)
RDW: 13.2 % (ref 11.5–15.5)
WBC: 8.8 10*3/uL (ref 4.0–10.5)
nRBC: 0 % (ref 0.0–0.2)

## 2021-05-21 LAB — BASIC METABOLIC PANEL
Anion gap: 5 (ref 5–15)
BUN: 20 mg/dL (ref 8–23)
CO2: 25 mmol/L (ref 22–32)
Calcium: 7.7 mg/dL — ABNORMAL LOW (ref 8.9–10.3)
Chloride: 113 mmol/L — ABNORMAL HIGH (ref 98–111)
Creatinine, Ser: 0.87 mg/dL (ref 0.61–1.24)
GFR, Estimated: >60 mL/min (ref >60)
Glucose, Bld: 105 mg/dL — ABNORMAL HIGH (ref 70–99)
Potassium: 4.1 mmol/L (ref 3.5–5.1)
Sodium: 143 mmol/L (ref 135–145)

## 2021-05-21 LAB — PROCALCITONIN: Procalcitonin: <0.1 ng/mL

## 2021-05-21 LAB — D-DIMER, QUANTITATIVE: D-Dimer, Quant: 0.36 ug/mL-FEU (ref 0.00–0.50)

## 2021-05-21 LAB — RESP PANEL BY RT-PCR (FLU A&B, COVID) ARPGX2
Influenza A by PCR: NEGATIVE
Influenza B by PCR: NEGATIVE
SARS Coronavirus 2 by RT PCR: POSITIVE — AB

## 2021-05-21 LAB — POC OCCULT BLOOD, ED: Fecal Occult Bld: POSITIVE — AB

## 2021-05-21 LAB — PREPARE RBC (CROSSMATCH)

## 2021-05-21 LAB — C-REACTIVE PROTEIN: CRP: 0.5 mg/dL (ref <1.0)

## 2021-05-21 LAB — HEMOGLOBIN: Hemoglobin: 9.4 g/dL — ABNORMAL LOW (ref 13.0–17.0)

## 2021-05-21 MED ORDER — ZINC SULFATE 220 (50 ZN) MG PO CAPS
220.0000 mg | ORAL_CAPSULE | Freq: Every day | ORAL | Status: DC
  Administered 2021-05-21 – 2021-05-24 (×4): 220 mg via ORAL
  Filled 2021-05-21 (×4): qty 1

## 2021-05-21 MED ORDER — ACETAMINOPHEN 650 MG RE SUPP: 650.0000 mg | Freq: Four times a day (QID) | RECTAL | Status: DC | PRN

## 2021-05-21 MED ORDER — MIDAZOLAM HCL 2 MG/2ML IJ SOLN
INTRAMUSCULAR | Status: AC | PRN
  Administered 2021-05-21: 1 mg via INTRAVENOUS

## 2021-05-21 MED ORDER — ONDANSETRON HCL 4 MG PO TABS: 4.0000 mg | ORAL_TABLET | Freq: Four times a day (QID) | ORAL | Status: DC | PRN

## 2021-05-21 MED ORDER — SODIUM CHLORIDE 0.9 % IV SOLN
100.0000 mg | Freq: Once | INTRAVENOUS | Status: AC
  Administered 2021-05-21: 100 mg via INTRAVENOUS
  Filled 2021-05-21: qty 20

## 2021-05-21 MED ORDER — IOHEXOL 240 MG/ML SOLN: 50.0000 mL | Freq: Once | INTRAMUSCULAR | Status: DC | PRN

## 2021-05-21 MED ORDER — SODIUM CHLORIDE 0.9 % IV SOLN: 200.0000 mg | Freq: Once | INTRAVENOUS | Status: DC

## 2021-05-21 MED ORDER — PEG-KCL-NACL-NASULF-NA ASC-C 100 G PO SOLR
1.0000 | Freq: Two times a day (BID) | ORAL | Status: DC
  Administered 2021-05-21: 200 g via ORAL
  Filled 2021-05-21: qty 1

## 2021-05-21 MED ORDER — GUAIFENESIN-DM 100-10 MG/5ML PO SYRP: 10.0000 mL | ORAL_SOLUTION | ORAL | Status: DC | PRN

## 2021-05-21 MED ORDER — LACTATED RINGERS IV SOLN: INTRAVENOUS | Status: DC

## 2021-05-21 MED ORDER — SODIUM CHLORIDE 0.9% IV SOLUTION: Freq: Once | INTRAVENOUS | Status: DC

## 2021-05-21 MED ORDER — ASCORBIC ACID 500 MG PO TABS
500.0000 mg | ORAL_TABLET | Freq: Every day | ORAL | Status: DC
  Administered 2021-05-21 – 2021-05-24 (×4): 500 mg via ORAL
  Filled 2021-05-21 (×4): qty 1

## 2021-05-21 MED ORDER — SODIUM CHLORIDE 0.9% IV SOLUTION: Freq: Once | INTRAVENOUS | Status: AC

## 2021-05-21 MED ORDER — LIDOCAINE HCL 1 % IJ SOLN
INTRAMUSCULAR | Status: AC
  Filled 2021-05-21: qty 20

## 2021-05-21 MED ORDER — ONDANSETRON HCL 4 MG/2ML IJ SOLN
4.0000 mg | Freq: Four times a day (QID) | INTRAMUSCULAR | Status: DC | PRN
  Administered 2021-05-21 (×2): 4 mg via INTRAVENOUS
  Filled 2021-05-21 (×2): qty 2

## 2021-05-21 MED ORDER — GELATIN ABSORBABLE 12-7 MM EX MISC
CUTANEOUS | Status: AC
  Filled 2021-05-21: qty 2

## 2021-05-21 MED ORDER — SODIUM CHLORIDE 0.9 % IV SOLN
100.0000 mg | Freq: Every day | INTRAVENOUS | Status: DC
Start: 2021-05-22 — End: 2021-05-21

## 2021-05-21 MED ORDER — FENTANYL CITRATE (PF) 100 MCG/2ML IJ SOLN
INTRAMUSCULAR | Status: AC
  Filled 2021-05-21: qty 2

## 2021-05-21 MED ORDER — ACETAMINOPHEN 325 MG PO TABS: 650.0000 mg | ORAL_TABLET | Freq: Four times a day (QID) | ORAL | Status: DC | PRN

## 2021-05-21 MED ORDER — SODIUM CHLORIDE 0.9 % IV SOLN
100.0000 mg | Freq: Every day | INTRAVENOUS | Status: AC
  Administered 2021-05-22 – 2021-05-23 (×2): 100 mg via INTRAVENOUS
  Filled 2021-05-21: qty 20
  Filled 2021-05-21: qty 100

## 2021-05-21 MED ORDER — MIDAZOLAM HCL 2 MG/2ML IJ SOLN
INTRAMUSCULAR | Status: AC
  Filled 2021-05-21: qty 2

## 2021-05-21 MED ORDER — FENTANYL CITRATE (PF) 100 MCG/2ML IJ SOLN
INTRAMUSCULAR | Status: AC | PRN
  Administered 2021-05-21: 25 ug via INTRAVENOUS

## 2021-05-21 MED ORDER — IOHEXOL 350 MG/ML SOLN
100.0000 mL | Freq: Once | INTRAVENOUS | Status: AC | PRN
  Administered 2021-05-21: 100 mL via INTRAVENOUS

## 2021-05-21 MED ORDER — PANTOPRAZOLE SODIUM 40 MG IV SOLR
40.0000 mg | Freq: Two times a day (BID) | INTRAVENOUS | Status: DC
  Administered 2021-05-21 – 2021-05-24 (×6): 40 mg via INTRAVENOUS
  Filled 2021-05-21 (×6): qty 40

## 2021-05-21 NOTE — Progress Notes (Signed)
Seen and examined and agree with plan of care as per my partners who saw the patient this morning  87 year old male BPH, CAD, prior diverticulosis and polyps (has not had a colonoscopy in many years) COVID-positive this admit admitted with probable diverticular bleed  IR has seen him and performed a CT angio that was negative for acute He remains on Protonix gtt. He is about to get his first unit of blood now   Overall seems hemodynamically stable although his blood pressure is slightly low at times so continue saline at 125/H He has minimal Oxygen requirement and looks asymptomatic without any cough so we will ask for a desat screen and take him off oxygen to see how he does--continue remdesivir, hold steroids for now given active bleed We will recheck hemoglobin every 24 as bleeding seems to have stopped on its own and no target or need for embolization  I will order COVID labs for the morning  Appreciate gastroenterology input in addition  Verneita Griffes, MD [facility] 3:23 PM

## 2021-05-21 NOTE — ED Notes (Signed)
Patient alert & oriented and talking in complete sentences. Daughter at bedside and updated on plan of care.

## 2021-05-21 NOTE — ED Notes (Signed)
PATIENT HAS HAD A TOTAL OF 4 LARGE BRIGHT RED STOOLS DURING THE NIGHT.

## 2021-05-21 NOTE — Procedures (Signed)
Interventional Radiology Procedure Note  Procedure: IMA ANGIO    Complications: None  Estimated Blood Loss:  MIN  Findings: NEG FOR ACTIVE ARTERIAL BLEED    Tamera Punt, MD

## 2021-05-21 NOTE — ED Notes (Signed)
Returned form CT

## 2021-05-21 NOTE — Consult Note (Addendum)
Referring Provider:  Dr. Verlon Au, [facility] Primary Care Physician:  Redmond School, MD Primary Gastroenterologist:  Althia Forts   Reason for Consultation:  GI bleeding  HPI: Jaime MCCRUDDEN is a 87 y.o. male with medical history significant of BPH, CAD, colon diverticulosis, exertional dyspnea, nephrolithiasis, hyperlipidemia, osteoarthritis who went to the ED at [facility] due to multiple episodes of hematochezia at home.  He said that it started suddenly last night around 9 PM.  Never had any bleeding like this in the past.  He reports a colonoscopy several years ago at [facility].  Initial Hemoglobin was 12.8 g/dL and platelets 337.  Follow-up hemoglobin was 9.4 g/dL.  PT and INR were normal.  CMP was normal.  COVID PCR was incidentally positive and there is history of exposure about 10 days ago.  Patient is asymptomatic.   CT angio showed contrast extravasation noted in the proximal sigmoid colon likely source of GI bleed from a diverticulum.  He was transferred to [facility] due to the large volume of ongoing bleeding.  He went for angiogram with IR this afternoon, about 12 hours after the CT angio was performed, but that was negative.  Hemoglobin at 8.7 g.  He has not had any blood transfusion yet, but is about to receive a unit.  His daughter is at bedside and they tell me that the bleeding has slowed since this morning.  Last blood that was passed was right before he went down to IR.  He denies any nausea, vomiting, abdominal pain.  He admits to feeling weak.  No NSAIDs or blood thinners at home.  He says that yesterday during the day he mowed his lawn, etc.  Past Medical History:  Diagnosis Date   BPH (benign prostatic hyperplasia)    Coronary atherosclerosis of native coronary artery    Minimal coronary atherosclerosis at catheterization 2003   Diverticulosis of colon    Exertional dyspnea    History of colonic polyps    History of exercise stress test 05-31-2011    dr Lattie Haw   adequate but abnormal graded exercise test revealing reasonably good exercise capacity, no chest discomfort or other ischemic symptoms, borderline hypertensive blood pressure response and an ecg response consistent with myocardial ishcemia , but with very rapid resolution after the cessation of exercise suggesting a possible false positive ecg response    History of kidney stones    Left ureteral stone    Mixed hyperlipidemia    Nephrolithiasis    Osteoarthritis    Wears glasses    Wears hearing aid in both ears    Wears partial dentures    upper only    Past Surgical History:  Procedure Laterality Date   CARDIAC CATHETERIZATION  02-27-2002  dr hochrein   minimal coronary plaquing , normal lvf   CATARACT EXTRACTION W/PHACO Left 06/27/2020   Procedure: CATARACT EXTRACTION PHACO AND INTRAOCULAR LENS PLACEMENT LEFT EYE;  Surgeon: Baruch Goldmann, MD;  Location: [facility];  Service: Ophthalmology;  Laterality: Left;  CDE:16.96   CATARACT EXTRACTION W/PHACO Right 07/11/2020   Procedure: CATARACT EXTRACTION PHACO AND INTRAOCULAR LENS PLACEMENT (IOC);  Surgeon: Baruch Goldmann, MD;  Location: [facility];  Service: Ophthalmology;  Laterality: Right;  CDE: 20.43   CYSTOSCOPY W/ URETERAL STENT PLACEMENT Left 09/23/2017   Procedure: CYSTOSCOPY WITH RETROGRADE PYELOGRAM/URETERAL LEFT STENT PLACEMENT;  Surgeon: Ceasar Mons, MD;  Location: [facility];  Service: Urology;  Laterality: Left;   CYSTOSCOPY/URETEROSCOPY/HOLMIUM LASER/STENT PLACEMENT Left 10/02/2017   Procedure:  CYSTOSCOPY/URETEROSCOPY/HOLMIUM LASER/STENT PLACEMENT, STONE BASKETRY;  Surgeon: Ceasar Mons, MD;  Location: [facility];  Service: Urology;  Laterality: Left;  ONLY NEEDS 45 MIN FOR PROCEDURE   EXTRACORPOREAL SHOCK WAVE LITHOTRIPSY  2002   IR ANGIOGRAM VISCERAL SELECTIVE  05/21/2021   IR US GUIDE VASC ACCESS RIGHT  05/21/2021   TRANSTHORACIC ECHOCARDIOGRAM  05/31/2011   dr s. Domenic Polite   mild  concentric LVH, ef 60-65%/  moderately calcified AV w/ mild AV stenosis, no regurg. (mean grandient 14mHg, peak gradient 167mg)/ trivial TR    Prior to Admission medications   Medication Sig Start Date End Date Taking? Authorizing Provider  acetaminophen (TYLENOL) 325 MG tablet Take 650 mg by mouth every 6 (six) hours as needed for headache or moderate pain.   Yes [provider]  amLODipine (NORVASC) 5 MG tablet Take 5 mg by mouth daily. 05/10/18  Yes [provider]  atorvastatin (LIPITOR) 40 MG tablet Take 40 mg by mouth daily. 04/12/18  Yes [provider]  multivitamin (ONE-A-DAY MEN'S) TABS tablet Take 1 tablet by mouth daily.   Yes [provider]  tamsulosin (FLOMAX) 0.4 MG CAPS capsule Take 0.4 mg by mouth daily. 05/30/18  Yes [provider]  amoxicillin (AMOXIL) 500 MG capsule Take 2 capsules (1,000 mg total) by mouth 3 (three) times daily. Patient not taking: Reported on 05/21/2021 09/14/20   WaBarrie FolkPA-C    Current Facility-Administered Medications  Medication Dose Route Frequency Provider Last Rate Last Admin   0.9 %  sodium chloride infusion (Manually program via Guardrails IV Fluids)   Intravenous Once OrReubin MilanMD       0.9 %  sodium chloride infusion   Intravenous Continuous OrReubin MilanMD       acetaminophen (TYLENOL) tablet 650 mg  650 mg Oral Q6H PRN OrReubin MilanMD       Or   acetaminophen (TYLENOL) suppository 650 mg  650 mg Rectal Q6H PRN OrReubin MilanMD       ascorbic acid (VITAMIN C) tablet 500 mg  500 mg Oral Daily OrReubin MilanMD   500 mg at 05/21/21 1013   guaiFENesin-dextromethorphan (ROBITUSSIN DM) 100-10 MG/5ML syrup 10 mL  10 mL Oral Q4H PRN OrReubin MilanMD       iohexol (OMNIPAQUE) 240 MG/ML injection 50 mL  50 mL Intra-arterial Once PRN ShGreggory KeenMD       iohexol (OMNIPAQUE) 240 MG/ML injection 50 mL  50 mL Intra-arterial Once PRN ShGreggory KeenMD       lactated ringers infusion   Intravenous Continuous Johnson, Clanford L, MD 50 mL/hr at 05/21/21 1050 Rate Change at 05/21/21 1050   lidocaine (XYLOCAINE) 1 % (with pres) injection            ondansetron (ZOFRAN) tablet 4 mg  4 mg Oral Q6H PRN OrReubin MilanMD       Or   ondansetron (ZEndo [facility]urg[facility] Center Of [facility] LLCinjection 4 mg  4 mg Intravenous Q6H PRN OrReubin MilanMD   4 mg at 05/21/21 1013   pantoprozole (PROTONIX) 80 mg /NS 100 mL infusion  8 mg/hr Intravenous Continuous OrReubin MilanMD 10 mL/hr at 05/21/21 1223 8 mg/hr at 05/21/21 1223   [START ON 05/22/2021] remdesivir 100 mg in sodium chloride 0.9 % 100 mL IVPB  100 mg Intravenous Daily OrReubin MilanMD       zinc sulfate capsule 220 mg  220  mg Oral Daily Reubin Milan, MD   220 mg at 05/21/21 1013    Allergies as of 05/20/2021   (No Known Allergies)    Family History  Problem Relation Age of Onset   Cancer Father        Bone cancer   Thyroid disease Neg Hx     Social History   Socioeconomic History   Marital status: Married    Spouse name: Lyndell Meadors   Number of children: 3   Years of education: Not on file   Highest education level: Not on file  Occupational History   Occupation: Retired    Fish farm manager: RETIRED    Comment: Truck driver  Tobacco Use   Smoking status: Never   Smokeless tobacco: Current    Types: Chew   Tobacco comments:    chew tobacco for 30 years (as of 09-30-2017)  Vaping Use   Vaping Use: Never used  Substance and Sexual Activity   Alcohol use: No   Drug use: No   Sexual activity: Not on file  Other Topics Concern   Not on file  Social History Narrative   Not on file   Social Determinants of Health   Financial Resource Strain: Not on file  Food Insecurity: Not on file  Transportation Needs: Not on file  Physical Activity: Not on file  Stress: Not on file  Social Connections: Not on file  Intimate Partner Violence: Not on file   Review of Systems: ROS  is O/W negative except as mentioned in HPI.  Physical Exam: Vital signs in last 24 hours: Temp:  [97.7 F (36.5 C)-98.3 F (36.8 C)] 97.7 F (36.5 C) (09/04 1452) Pulse Rate:  [60-103] 89 (09/04 1452) Resp:  [11-22] 16 (09/04 1340) BP: (91-155)/(27-78) 115/62 (09/04 1452) SpO2:  [93 %-100 %] 95 % (09/04 1452) Weight:  [68.9 kg] 68.9 kg (09/03 2239) Last BM Date: 05/21/21 General:  Alert, Well-developed, well-nourished, pleasant and cooperative in NAD Head:  Normocephalic and atraumatic. Eyes:  Sclera clear, no icterus.  Conjunctiva pink. Ears:  Normal auditory acuity. Mouth:  No deformity or lesions.   Lungs:  Clear throughout to auscultation.  No wheezes, crackles, or rhonchi.  Heart:  Regular rate and rhythm; no murmurs, clicks, rubs,  or gallops. Abdomen:  Soft, non-distended.  BS present.  Non-tender.  Msk:  Symmetrical without gross deformities. Pulses:  Normal pulses noted. Extremities:  Without clubbing or edema. Neurologic:  Alert and oriented x 4;  grossly normal neurologically. Skin:  Intact without significant lesions or rashes. Psych:  Alert and cooperative. Normal mood and affect.  Intake/Output this shift: Total I/O In: 110 [I.V.:10; IV Piggyback:100] Out: -   Lab Results: Recent Labs    05/20/21 2300 05/21/21 0522 05/21/21 1052  WBC 9.0  --  8.8  HGB 12.8* 9.4* 8.7*  HCT 39.0  --  26.7*  PLT 337  --  264   BMET Recent Labs    05/20/21 2300 05/21/21 0522  NA 140 143  K 3.8 4.1  CL 109 113*  CO2 25 25  GLUCOSE 108* 105*  BUN 23 20  CREATININE 0.96 0.87  CALCIUM 8.3* 7.7*   LFT Recent Labs    05/20/21 2300  PROT 7.3  ALBUMIN 3.9  AST 29  ALT 27  ALKPHOS 73  BILITOT 1.0   PT/INR Recent Labs    05/20/21 2300  LABPROT 14.7  INR 1.1   Studies/Results: IR Angiogram Visceral Selective  Result Date: 05/21/2021 INDICATION:  Acute left lower quadrant proximal sigmoid diverticular GI bleed EXAM: Ultrasound guidance for vascular access  Selective IMA catheterization and angiogram MEDICATIONS: 1% lidocaine local. ANESTHESIA/SEDATION: Moderate (conscious) sedation was employed during this procedure. A total of Versed 1.0 mg and Fentanyl 25 mcg was administered intravenously. Moderate Sedation Time: 31 minutes. The patient's level of consciousness and vital signs were monitored continuously by radiology nursing throughout the procedure under my direct supervision. CONTRAST:  75 cc omni 300 FLUOROSCOPY TIME:  Fluoroscopy Time: 2 minutes 48 seconds (195 mGy). COMPLICATIONS: None immediate. PROCEDURE: Informed consent was obtained from the patient following explanation of the procedure, risks, benefits and alternatives. The patient understands, agrees and consents for the procedure. All questions were addressed. A time out was performed prior to the initiation of the procedure. Maximal barrier sterile technique utilized including caps, mask, sterile gowns, sterile gloves, large sterile drape, hand hygiene, and Betadine prep. Under sterile conditions and local anesthesia, ultrasound micropuncture access performed the right common femoral artery. Five French sheath inserted over a Bentson guidewire. A Sos Omni select catheter was advanced over a Bentson guidewire and reformed in the aorta. Selective catheterization of the IMA performed. IMA angiogram performed. IMA: IMA and its branches are widely patent. No active arterial bleeding in the left lower quadrant proximal sigmoid colon to correlate with the CTA finding. IMA angiography was repeated over two 5 minute intervals without any evidence of intermittent active arterial bleeding. On the venous phase, there is some tortuosity of the upper rectal draining veins into the patent IMV noted. Access removed. Hemostasis obtained with manual compression. No immediate complication. Patient tolerated the procedure well. IMPRESSION: Negative IMA angiogram for active arterial bleeding. Electronically Signed   By:  Jerilynn Mages.  Shick M.D.   On: 05/21/2021 13:47   IR US Guide Vasc Access Right  Result Date: 05/21/2021 INDICATION: Acute left lower quadrant proximal sigmoid diverticular GI bleed EXAM: Ultrasound guidance for vascular access Selective IMA catheterization and angiogram MEDICATIONS: 1% lidocaine local. ANESTHESIA/SEDATION: Moderate (conscious) sedation was employed during this procedure. A total of Versed 1.0 mg and Fentanyl 25 mcg was administered intravenously. Moderate Sedation Time: 31 minutes. The patient's level of consciousness and vital signs were monitored continuously by radiology nursing throughout the procedure under my direct supervision. CONTRAST:  75 cc omni 300 FLUOROSCOPY TIME:  Fluoroscopy Time: 2 minutes 48 seconds (195 mGy). COMPLICATIONS: None immediate. PROCEDURE: Informed consent was obtained from the patient following explanation of the procedure, risks, benefits and alternatives. The patient understands, agrees and consents for the procedure. All questions were addressed. A time out was performed prior to the initiation of the procedure. Maximal barrier sterile technique utilized including caps, mask, sterile gowns, sterile gloves, large sterile drape, hand hygiene, and Betadine prep. Under sterile conditions and local anesthesia, ultrasound micropuncture access performed the right common femoral artery. Five French sheath inserted over a Bentson guidewire. A Sos Omni select catheter was advanced over a Bentson guidewire and reformed in the aorta. Selective catheterization of the IMA performed. IMA angiogram performed. IMA: IMA and its branches are widely patent. No active arterial bleeding in the left lower quadrant proximal sigmoid colon to correlate with the CTA finding. IMA angiography was repeated over two 5 minute intervals without any evidence of intermittent active arterial bleeding. On the venous phase, there is some tortuosity of the upper rectal draining veins into the patent IMV noted.  Access removed. Hemostasis obtained with manual compression. No immediate complication. Patient tolerated the procedure well. IMPRESSION: Negative IMA angiogram  for active arterial bleeding. Electronically Signed   By: Jerilynn Mages.  Shick M.D.   On: 05/21/2021 13:47   DG Chest Port 1 View  Result Date: 05/20/2021 CLINICAL DATA:  Shortness of breath EXAM: PORTABLE CHEST 1 VIEW COMPARISON:  09/14/2020 FINDINGS: Stable chronic elevation of the right hemidiaphragm. Chronic changes in the lungs with scarring. Heart is normal size. No acute opacities or effusions. IMPRESSION: Stable chronic elevation of the right hemidiaphragm. Stable chronic interstitial prominence, likely chronic lung disease/scarring. Electronically Signed   By: Rolm Baptise M.D.   On: 05/20/2021 23:40   CT Angio Abd/Pel W and/or Wo Contrast  Result Date: 05/21/2021 CLINICAL DATA:  Rectal bleeding EXAM: CTA ABDOMEN AND PELVIS WITHOUT AND WITH CONTRAST TECHNIQUE: Multidetector CT imaging of the abdomen and pelvis was performed using the standard protocol during bolus administration of intravenous contrast. Multiplanar reconstructed images and MIPs were obtained and reviewed to evaluate the vascular anatomy. CONTRAST:  138m OMNIPAQUE IOHEXOL 350 MG/ML SOLN COMPARISON:  09/21/2017 FINDINGS: VASCULAR Aorta: Heavily calcified aorta.  No aneurysm.  No dissection. Celiac: Patent without evidence of aneurysm, dissection, vasculitis or significant stenosis. SMA: Patent without evidence of aneurysm, dissection, vasculitis or significant stenosis. Renals: Both renal arteries are patent without evidence of aneurysm, dissection, vasculitis, fibromuscular dysplasia or significant stenosis. IMA: Patent without evidence of aneurysm, dissection, vasculitis or significant stenosis. Inflow: Patent without evidence of aneurysm, dissection, vasculitis or significant stenosis. Proximal Outflow: Bilateral common femoral and visualized portions of the superficial and profunda  femoral arteries are patent without evidence of aneurysm, dissection, vasculitis or significant stenosis. Veins: No obvious venous abnormality within the limitations of this arterial phase study. Review of the MIP images confirms the above findings. NON-VASCULAR Lower chest: No acute abnormality Hepatobiliary: No focal hepatic abnormality. Gallbladder unremarkable. Pancreas: No focal abnormality or ductal dilatation. Spleen: No focal abnormality.  Normal size. Adrenals/Urinary Tract: Small cysts in the kidneys bilaterally. No stones or hydronephrosis. Adrenal glands and urinary bladder unremarkable. Stomach/Bowel: Diffuse colonic diverticulosis. There is contrast seen within the proximal sigmoid colon, likely active bleeding from a diverticulum. No bowel obstruction. Stomach and small bowel decompressed, grossly unremarkable. Lymphatic: No adenopathy Reproductive: Prostate enlargement Other: No free fluid or free air. Musculoskeletal: No acute bony abnormality. IMPRESSION: VASCULAR Contrast extravasation noted in the proximal sigmoid colon, likely source of GI bleed from a diverticulum. Aortic atherosclerosis. NON-VASCULAR Diffuse colonic diverticulosis.  No active diverticulitis. Prostate enlargement. Electronically Signed   By: KRolm BaptiseM.D.   On: 05/21/2021 01:55    IMPRESSION:  *Lower GI bleed: Diverticular bleed as seen by CTA with contrast extravasation noted in the proximal sigmoid colon likely from the diverticulum.  Negative angiogram with IR.  It seems that the bleeding has slowed since this morning.   *Acute blood loss anemia: Hemoglobin 8.7 g down from 13.2 g about 8 months ago.  Receiving a unit of PRBCs. *Covid 19 positive   PLAN: -Monitor hemoglobin and transfuse further if needed. -Continue supportive care for now. -? Need for reattempt at angio if rebleeds significantly.  ? Bowel prep to clean the colon.  Further recommendations per Dr. JArdis Hughs -I am going to discontinue PPI drip and  just do IV PPI twice daily.  JLaban Emperor Zehr  05/21/2021, 3:19 PM   ________________________________________________________________________  LVelora HecklerGI MD note:  I personally examined the patient, reviewed the data and agree with the assessment and plan described above.  He's had an atypical path to our care here at [facility]  Clearly having a  lower GI bleed, from around the sigmoid region. IR angiogram was negative for active bleeding. His last bloody output was around noon, he feels the bleeding has really slowed or even stopped and I agree.  I recommended that we proceed with a colonsocopy for further evaluation, treat bleeding if it recurs and he agreed. Will prep starting asap.    Owens Loffler, MD [facility] Gastroenterology Pager [phone]

## 2021-05-21 NOTE — Plan of Care (Signed)
  Problem: Pain Managment: Goal: General experience of comfort will improve Outcome: Progressing   

## 2021-05-21 NOTE — Progress Notes (Signed)
05/21/2021 11:14 AM  Pt seen just prior to transfer to [facility].  Hemodynamically stable. He is still bleeding but slowed down some.  Hg 9.4.  I ordered 1 unit PRBC.  I also called and spoke with Dr. Larena Glassman with IR at [facility] and they will go assess him now for intervention.  He is located at 6N30.  I will update [facility] MD at [facility].    Vitals:   05/21/21 0806 05/21/21 0956  BP:  116/60  Pulse:  88  Resp:  18  Temp: 97.8 F (36.6 C) 98.3 F (36.8 C)  SpO2:     Murvin Natal, MD How to contact the [facility] Attending or Consulting provider [place] or covering provider during after hours [place], for this patient?  Check the care team in [facility] and look for a) attending/consulting TRH provider listed and b) the [facility] team listed Log into [email or web address] and use [place]'s universal password to access. If you do not have the password, please contact the hospital operator. Locate the [facility] provider you are looking for under [facility] and page to a number that you can be directly reached. If you still have difficulty reaching the provider, please page the [facility] (Director on Call) for the Hospitalists listed on amion for assistance.

## 2021-05-21 NOTE — H&P (Signed)
History and Physical    Jaime [name] [id number] DOB: 10-Dec-1932 DOA: 05/20/2021  PCP: Redmond School, MD   Patient coming from: Home.   I have personally briefly reviewed patient's old medical records in [place]  Chief Complaint: Rectal bleeding.  HPI: Jaime [name] is a 87 y.o. male with medical history significant of BPH, CAD, colon diverticulosis and polyps, exertional dyspnea, nephrolithiasis, hyperlipidemia, osteoarthritis who is coming to the emergency department due to multiple episodes of hematochezia at home which have persisted while in the emergency department with several more times.  He has been lightheaded.  He denied abdominal pain or melena.  No nausea or emesis.  No dysuria, frequency or hematuria.  No previous history of rectal bleeding.  No fever, chills, rhinorrhea, sore throat, wheezing, dyspnea or hemoptysis.  No chest pain, palpitations, diaphoresis, PND, orthopnea or pitting edema of the lower extremities.  Denied polyuria, polydipsia, polyphagia or blurred vision.  ED Course: Initial vital signs were temperature 97.8 F, pulse 77, respiration 18, BP 155/78 mmHg O2 sat 95% on room air.  Lab work: CBC showed a white count of 9.0 hemoglobin 12.8 g/dL and platelets 337.  Follow-up hemoglobin was 9.4 g/dL.  PT and INR were normal.  Glucose added in a.m. calcium 8.3 mg/dL.  The rest of the CMP was normal.  COVID PCR was incidentally positive and there is history of exposure about 10 days ago.  Imaging: Chest x-ray shows chronic elevation of the right hemidiaphragm.  CTA abdomen shows extravasation of contrast and proximal sigmoid colon.  Review of Systems: As per HPI otherwise all other systems reviewed and are negative.  Past Medical History:  Diagnosis Date   BPH (benign prostatic hyperplasia)    Coronary atherosclerosis of native coronary artery    Minimal coronary atherosclerosis at catheterization 2003   Diverticulosis of colon    Exertional dyspnea     History of colonic polyps    History of exercise stress test 05-31-2011   dr Lattie Haw   adequate but abnormal graded exercise test revealing reasonably good exercise capacity, no chest discomfort or other ischemic symptoms, borderline hypertensive blood pressure response and an ecg response consistent with myocardial ishcemia , but with very rapid resolution after the cessation of exercise suggesting a possible false positive ecg response    History of kidney stones    Left ureteral stone    Mixed hyperlipidemia    Nephrolithiasis    Osteoarthritis    Wears glasses    Wears hearing aid in both ears    Wears partial dentures    upper only   Past Surgical History:  Procedure Laterality Date   CARDIAC CATHETERIZATION  02-27-2002  dr hochrein   minimal coronary plaquing , normal lvf   CATARACT EXTRACTION W/PHACO Left 06/27/2020   Procedure: CATARACT EXTRACTION PHACO AND INTRAOCULAR LENS PLACEMENT LEFT EYE;  Surgeon: Baruch Goldmann, MD;  Location: [facility];  Service: Ophthalmology;  Laterality: Left;  CDE:16.96   CATARACT EXTRACTION W/PHACO Right 07/11/2020   Procedure: CATARACT EXTRACTION PHACO AND INTRAOCULAR LENS PLACEMENT (IOC);  Surgeon: Baruch Goldmann, MD;  Location: [facility];  Service: Ophthalmology;  Laterality: Right;  CDE: 20.43   CYSTOSCOPY W/ URETERAL STENT PLACEMENT Left 09/23/2017   Procedure: CYSTOSCOPY WITH RETROGRADE PYELOGRAM/URETERAL LEFT STENT PLACEMENT;  Surgeon: Ceasar Mons, MD;  Location: [facility];  Service: Urology;  Laterality: Left;   CYSTOSCOPY/URETEROSCOPY/HOLMIUM LASER/STENT PLACEMENT Left 10/02/2017   Procedure: CYSTOSCOPY/URETEROSCOPY/HOLMIUM LASER/STENT PLACEMENT, STONE BASKETRY;  Surgeon: Ceasar Mons, MD;  Location: [facility];  Service: Urology;  Laterality: Left;  ONLY NEEDS 45 MIN FOR PROCEDURE   EXTRACORPOREAL SHOCK WAVE LITHOTRIPSY  2002   TRANSTHORACIC ECHOCARDIOGRAM  05/31/2011   dr s. Domenic Polite   mild concentric LVH,  ef 60-65%/  moderately calcified AV w/ mild AV stenosis, no regurg. (mean grandient 1mHg, peak gradient 125mg)/ trivial TR   Social History  reports that he has never smoked. His smokeless tobacco use includes chew. He reports that he does not drink alcohol and does not use drugs.  No Known Allergies  Family History  Problem Relation Age of Onset   Cancer Father        Bone cancer   Thyroid disease Neg Hx    Prior to Admission medications   Medication Sig Start Date End Date Taking? Authorizing Provider  amLODipine (NORVASC) 5 MG tablet Take 5 mg by mouth daily. 05/10/18   [provider]  amoxicillin (AMOXIL) 500 MG capsule Take 2 capsules (1,000 mg total) by mouth 3 (three) times daily. 09/14/20   WaBarrie FolkPA-C  aspirin EC 81 MG tablet Take 81 mg by mouth daily.    [provider]  atorvastatin (LIPITOR) 40 MG tablet Take 40 mg by mouth daily. 04/12/18   [provider]  multivitamin (ONE-A-DAY MEN'S) TABS tablet Take 1 tablet by mouth daily.    [provider]  PRESCRIPTION MEDICATION Place 1 drop into the left eye in the morning, at noon, and at bedtime. Prednisolone, Bromfenac and Moxifloxacin Drop    [provider]  tamsulosin (FLOMAX) 0.4 MG CAPS capsule Take 0.4 mg by mouth daily. 05/30/18   [provider]   Physical Exam: Vitals:   05/20/21 2239 05/20/21 2330 05/21/21 0000 05/21/21 0030  BP:  118/72 (!) 107/59 (!) 115/27  Pulse:  95 68 76  Resp:  '13 19 16  '$ Temp:      TempSrc:      SpO2:  95% 95% 96%  Weight: 68.9 kg     Height: '5\' 8"'$  (1.727 m)      Constitutional: NAD, calm, comfortable Eyes: PERRL, lids and conjunctivae are pale. ENMT: Mucous membranes are moist. Posterior pharynx clear of any exudate or lesions. Neck: normal, supple, no masses, no thyromegaly Respiratory: clear to auscultation bilaterally, no wheezing, no crackles. Normal respiratory effort. No accessory muscle use.   Cardiovascular: Regular rate and rhythm, 1/6 systolic murmur, no rubs / gallops. No extremity edema. 2+ pedal pulses. No carotid bruits.  Abdomen: No distention.  Bowel sounds positive.  Soft, no tenderness, no masses palpated. No hepatosplenomegaly.  Musculoskeletal: no clubbing / cyanosis. Good ROM, no contractures. Normal muscle tone.  Skin: no rashes, lesions, ulcers on very limited dermatological examination. Neurologic: CN 2-12 grossly intact. Sensation intact, DTR normal. Strength 5/5 in all 4.  Psychiatric: Normal judgment and insight. Alert and oriented x 3. Normal mood.   Labs on Admission: I have personally reviewed following labs and imaging studies  CBC: Recent Labs  Lab 05/20/21 2300  WBC 9.0  HGB 12.8*  HCT 39.0  MCV 95.1  PLT [id number] Basic Metabolic Panel: Recent Labs  Lab 05/20/21 2300  NA 140  K 3.8  CL 109  CO2 25  GLUCOSE 108*  BUN 23  CREATININE 0.96  CALCIUM 8.3*   GFR: Estimated Creatinine Clearance: 54.4 mL/min (by C-G formula based on SCr of 0.96 mg/dL).  Liver Function Tests: Recent Labs  Lab 05/20/21 2300  AST 29  ALT 27  ALKPHOS 73  BILITOT 1.0  PROT 7.3  ALBUMIN 3.9   Radiological Exams on Admission: DG Chest Port 1 View  Result Date: 05/20/2021 CLINICAL DATA:  Shortness of breath EXAM: PORTABLE CHEST 1 VIEW COMPARISON:  09/14/2020 FINDINGS: Stable chronic elevation of the right hemidiaphragm. Chronic changes in the lungs with scarring. Heart is normal size. No acute opacities or effusions. IMPRESSION: Stable chronic elevation of the right hemidiaphragm. Stable chronic interstitial prominence, likely chronic lung disease/scarring. Electronically Signed   By: Rolm Baptise M.D.   On: 05/20/2021 23:40    EKG: Independently reviewed.   Assessment/Plan Principal Problem:   Bright red rectal bleeding   Acute blood loss anemia Observation/telemetry. Keep NPO. Monitor H&H. Transfuse as needed. Continue pantoprazole. Consult  gastroenterology. Transfer to [facility] per IR recommendation.  Active Problems: COVID-19 disease. Incidental finding. Continue remdesivir.    Mixed hyperlipidemia Continue atorvastatin.    Coronary atherosclerosis of native coronary artery No longer taking aspirin. Continue atorvastatin after med rec performed.    Hypocalcemia Recheck calcium level in AM.    Hypertension Hold antihypertensives due to significant blood loss. Monitor blood pressure closely.    DVT prophylaxis: SCDs. Code Status:   Full code. Family Communication:  His daughter was at bedside. Disposition Plan:   Patient is from:  Home.  Anticipated DC to:  Home.  Anticipated DC date:  05/22/2021.  Anticipated DC barriers: Clinical status. Consults called:   Admission status:  Observation/telemetry.   Severity of Illness: High severity after presenting with rectal bleeding which is secondary to a bleeding diverticula showing extravasation of contrast into the bowel.  Interventional radiology requested transfer to tertiary center.  Reubin Milan MD [facility]  How to contact the [facility] Attending or Consulting provider [place] or covering provider during after hours [place], for this patient?   Check the care team in [facility] and look for a) attending/consulting [facility] provider listed and b) the [facility] team listed Log into [email or web address] and use [place]'s universal password to access. If you do not have the password, please contact the hospital operator. Locate the [facility] provider you are looking for under [facility] and page to a number that you can be directly reached. If you still have difficulty reaching the provider, please page the [facility] (Director on Call) for the Hospitalists listed on amion for assistance.  05/21/2021, 1:48 AM   This document was prepared using Dragon voice recognition software and may contain some unintended transcription errors.

## 2021-05-21 NOTE — Consult Note (Signed)
Chief Complaint: Patient was seen in consultation today for mesenteric/visceral arteriogram with possible embolization Chief Complaint  Patient presents with   Rectal Bleeding    Referring Physician(s): [place]  Supervising Physician: Daryll Brod  Patient Status: [facility]  History of Present Illness: Jaime [name] is an 87 y.o. male with past medical history significant for BPH, mild coronary artery disease, diverticulosis, colonic polyps, nephrolithiasis, hyperlipidemia, osteoarthritis, and hearing loss who was transferred from [facility] to [facility] today secondary to persistent rectal bleeding.  Patient states bleeding started yesterday evening.  CT angio of abdomen pelvis performed today revealed:   IMPRESSION: VASCULAR   Contrast extravasation noted in the proximal sigmoid colon, likely source of GI bleed from a diverticulum.   Aortic atherosclerosis.   NON-VASCULAR   Diffuse colonic diverticulosis.  No active diverticulitis.   Prostate enlargement  Patient is afebrile, blood pressure 116/60, heart rate 88, WBC normal, hemoglobin 8.7, platelets normal, creatinine normal, PT/INR normal, patient noted to be incidentally COVID-19 positive although he is currently asymptomatic.  He currently denies fever, headache, chest pain, cough, abdominal/back pain, nausea, vomiting.  He does have some exertional dyspnea.  Request now received for visceral arteriogram with possible embolization.  Past Medical History:  Diagnosis Date   BPH (benign prostatic hyperplasia)    Coronary atherosclerosis of native coronary artery    Minimal coronary atherosclerosis at catheterization 2003   Diverticulosis of colon    Exertional dyspnea    History of colonic polyps    History of exercise stress test 05-31-2011   dr Lattie Haw   adequate but abnormal graded exercise test revealing reasonably good exercise capacity, no chest discomfort or other ischemic symptoms,  borderline hypertensive blood pressure response and an ecg response consistent with myocardial ishcemia , but with very rapid resolution after the cessation of exercise suggesting a possible false positive ecg response    History of kidney stones    Left ureteral stone    Mixed hyperlipidemia    Nephrolithiasis    Osteoarthritis    Wears glasses    Wears hearing aid in both ears    Wears partial dentures    upper only    Past Surgical History:  Procedure Laterality Date   CARDIAC CATHETERIZATION  02-27-2002  dr hochrein   minimal coronary plaquing , normal lvf   CATARACT EXTRACTION W/PHACO Left 06/27/2020   Procedure: CATARACT EXTRACTION PHACO AND INTRAOCULAR LENS PLACEMENT LEFT EYE;  Surgeon: Baruch Goldmann, MD;  Location: [facility];  Service: Ophthalmology;  Laterality: Left;  CDE:16.96   CATARACT EXTRACTION W/PHACO Right 07/11/2020   Procedure: CATARACT EXTRACTION PHACO AND INTRAOCULAR LENS PLACEMENT (IOC);  Surgeon: Baruch Goldmann, MD;  Location: [facility];  Service: Ophthalmology;  Laterality: Right;  CDE: 20.43   CYSTOSCOPY W/ URETERAL STENT PLACEMENT Left 09/23/2017   Procedure: CYSTOSCOPY WITH RETROGRADE PYELOGRAM/URETERAL LEFT STENT PLACEMENT;  Surgeon: Ceasar Mons, MD;  Location[facility];  Service: Urology;  Laterality: Left;   CYSTOSCOPY/URETEROSCOPY/HOLMIUM LASER/STENT PLACEMENT Left 10/02/2017   Procedure: CYSTOSCOPY/URETEROSCOPY/HOLMIUM LASER/STENT PLACEMENT, STONE BASKETRY;  Surgeon: Ceasar Mons, MD;  Location: [facility];  Service: Urology;  Laterality: Left;  ONLY NEEDS 45 MIN FOR PROCEDURE   EXTRACORPOREAL SHOCK WAVE LITHOTRIPSY  2002   TRANSTHORACIC ECHOCARDIOGRAM  05/31/2011   dr s. Domenic Polite   mild concentric LVH, ef 60-65%/  moderately calcified AV w/ mild AV stenosis, no regurg. (mean grandient 77mHg, peak gradient 112mg)/ trivial TR  Allergies: Patient has no known allergies.  Medications: Prior to Admission medications    Medication Sig Start Date End Date Taking? Authorizing Provider  amLODipine (NORVASC) 5 MG tablet Take 5 mg by mouth daily. 05/10/18   [provider]  amoxicillin (AMOXIL) 500 MG capsule Take 2 capsules (1,000 mg total) by mouth 3 (three) times daily. 09/14/20   Barrie Folk, PA-C  aspirin EC 81 MG tablet Take 81 mg by mouth daily.    [provider]  atorvastatin (LIPITOR) 40 MG tablet Take 40 mg by mouth daily. 04/12/18   [provider]  multivitamin (ONE-A-DAY MEN'S) TABS tablet Take 1 tablet by mouth daily.    [provider]  PRESCRIPTION MEDICATION Place 1 drop into the left eye in the morning, at noon, and at bedtime. Prednisolone, Bromfenac and Moxifloxacin Drop    [provider]  tamsulosin (FLOMAX) 0.4 MG CAPS capsule Take 0.4 mg by mouth daily. 05/30/18   [provider]     Family History  Problem Relation Age of Onset   Cancer Father        Bone cancer   Thyroid disease Neg Hx     Social History   Socioeconomic History   Marital status: Married    Spouse name: Basir Leahey   Number of children: 3   Years of education: Not on file   Highest education level: Not on file  Occupational History   Occupation: Retired    Fish farm manager: RETIRED    Comment: Truck driver  Tobacco Use   Smoking status: Never   Smokeless tobacco: Current    Types: Chew   Tobacco comments:    chew tobacco for 30 years (as of 09-30-2017)  Vaping Use   Vaping Use: Never used  Substance and Sexual Activity   Alcohol use: No   Drug use: No   Sexual activity: Not on file  Other Topics Concern   Not on file  Social History Narrative   Not on file   Social Determinants of Health   Financial Resource Strain: Not on file  Food Insecurity: Not on file  Transportation Needs: Not on file  Physical Activity: Not on file  Stress: Not on file  Social Connections: Not on file      Review of Systems SEE ABOVE  Vital Signs: BP  116/60 (BP Location: Right Arm)   Pulse 88   Temp 98.3 F (36.8 C)   Resp 18   Ht '5\' 8"'$  (1.727 m)   Wt 152 lb (68.9 kg)   SpO2 93%   BMI 23.11 kg/m   Physical Exam awake, alert.  Daughter in room. Chest- sl dim BS rt base, left clear; heart- RRR , murmur noted; abd- soft, +BS,NT; no LE edema  Imaging: DG Chest Port 1 View  Result Date: 05/20/2021 CLINICAL DATA:  Shortness of breath EXAM: PORTABLE CHEST 1 VIEW COMPARISON:  09/14/2020 FINDINGS: Stable chronic elevation of the right hemidiaphragm. Chronic changes in the lungs with scarring. Heart is normal size. No acute opacities or effusions. IMPRESSION: Stable chronic elevation of the right hemidiaphragm. Stable chronic interstitial prominence, likely chronic lung disease/scarring. Electronically Signed   By: Rolm Baptise M.D.   On: 05/20/2021 23:40   CT Angio Abd/Pel W and/or Wo Contrast  Result Date: 05/21/2021 CLINICAL DATA:  Rectal bleeding EXAM: CTA ABDOMEN AND PELVIS WITHOUT AND WITH CONTRAST TECHNIQUE: Multidetector CT imaging of the abdomen and pelvis was performed using the standard protocol during bolus administration of intravenous  contrast. Multiplanar reconstructed images and MIPs were obtained and reviewed to evaluate the vascular anatomy. CONTRAST:  155m OMNIPAQUE IOHEXOL 350 MG/ML SOLN COMPARISON:  09/21/2017 FINDINGS: VASCULAR Aorta: Heavily calcified aorta.  No aneurysm.  No dissection. Celiac: Patent without evidence of aneurysm, dissection, vasculitis or significant stenosis. SMA: Patent without evidence of aneurysm, dissection, vasculitis or significant stenosis. Renals: Both renal arteries are patent without evidence of aneurysm, dissection, vasculitis, fibromuscular dysplasia or significant stenosis. IMA: Patent without evidence of aneurysm, dissection, vasculitis or significant stenosis. Inflow: Patent without evidence of aneurysm, dissection, vasculitis or significant stenosis. Proximal Outflow: Bilateral common femoral  and visualized portions of the superficial and profunda femoral arteries are patent without evidence of aneurysm, dissection, vasculitis or significant stenosis. Veins: No obvious venous abnormality within the limitations of this arterial phase study. Review of the MIP images confirms the above findings. NON-VASCULAR Lower chest: No acute abnormality Hepatobiliary: No focal hepatic abnormality. Gallbladder unremarkable. Pancreas: No focal abnormality or ductal dilatation. Spleen: No focal abnormality.  Normal size. Adrenals/Urinary Tract: Small cysts in the kidneys bilaterally. No stones or hydronephrosis. Adrenal glands and urinary bladder unremarkable. Stomach/Bowel: Diffuse colonic diverticulosis. There is contrast seen within the proximal sigmoid colon, likely active bleeding from a diverticulum. No bowel obstruction. Stomach and small bowel decompressed, grossly unremarkable. Lymphatic: No adenopathy Reproductive: Prostate enlargement Other: No free fluid or free air. Musculoskeletal: No acute bony abnormality. IMPRESSION: VASCULAR Contrast extravasation noted in the proximal sigmoid colon, likely source of GI bleed from a diverticulum. Aortic atherosclerosis. NON-VASCULAR Diffuse colonic diverticulosis.  No active diverticulitis. Prostate enlargement. Electronically Signed   By: KRolm BaptiseM.D.   On: 05/21/2021 01:55    Labs:  CBC: Recent Labs    09/14/20 1431 05/20/21 2300 05/21/21 0522 05/21/21 1052  WBC 16.0* 9.0  --  8.8  HGB 13.2 12.8* 9.4* 8.7*  HCT 40.5 39.0  --  26.7*  PLT 332 337  --  264    COAGS: Recent Labs    05/20/21 2300  INR 1.1    BMP: Recent Labs    09/14/20 1431 05/20/21 2300 05/21/21 0522  NA 137 140 143  K 3.7 3.8 4.1  CL 103 109 113*  CO2 '22 25 25  '$ GLUCOSE 150* 108* 105*  BUN '18 23 20  '$ CALCIUM 8.5* 8.3* 7.7*  CREATININE 1.13 0.96 0.87  GFRNONAA >60 >60 >60    LIVER FUNCTION TESTS: Recent Labs    09/14/20 1431 05/20/21 2300  BILITOT 2.3* 1.0   AST 29 29  ALT 19 27  ALKPHOS 83 73  PROT 7.9 7.3  ALBUMIN 3.8 3.9    TUMOR MARKERS: No results for input(s): AFPTM, CEA, CA199, CHROMGRNA in the last 8760 hours.  Assessment and Plan: 87y.o. male with past medical history significant for BPH, mild coronary artery disease, diverticulosis, colonic polyps, nephrolithiasis, hyperlipidemia, osteoarthritis, and hearing loss [facility].  Patient states bleeding started yesterday evening.  CT angio of abdomen pelvis performed today revealed:   IMPRESSION: VASCULAR   Contrast extravasation noted in the proximal sigmoid colon, likely source of GI bleed from a diverticulum.   Aortic atherosclerosis.   NON-VASCULAR   Diffuse colonic diverticulosis.  No active diverticulitis.   Prostate enlargement  Patient is afebrile, blood pressure 116/60, heart rate 88, WBC normal, hemoglobin 8.7, platelets normal, creatinine normal, PT/INR normal, patient noted to be incidentally COVID-19 positive although he is currently asymptomatic.  He  currently denies fever, headache, chest pain, cough, abdominal/back pain, nausea, vomiting.  He does have some exertional dyspnea.  Request now received for visceral arteriogram with possible embolization.  Case has been reviewed by Dr. Annamaria Boots. Risks and benefits of procedure were discussed with the patient /daughter including, but not limited to bleeding, infection, vascular injury or contrast induced renal failure.  This interventional procedure involves the use of X-rays and because of the nature of the planned procedure, it is possible that we will have prolonged use of X-ray fluoroscopy.  Potential radiation risks to you include (but are not limited to) the following: - A slightly elevated risk for cancer  several years later in life. This risk is typically less than 0.5% percent. This risk is low in comparison to the  normal incidence of human cancer, which is 33% for women and 50% for men according to the [employer]. - Radiation induced injury can include skin redness, resembling a rash, tissue breakdown / ulcers and hair loss (which can be temporary or permanent).   The likelihood of either of these occurring depends on the difficulty of the procedure and whether you are sensitive to radiation due to previous procedures, disease, or genetic conditions.   IF your procedure requires a prolonged use of radiation, you will be notified and given written instructions for further action.  It is your responsibility to monitor the irradiated area for the 2 weeks following the procedure and to notify your physician if you are concerned that you have suffered a radiation induced injury.    All of the patient's questions were answered, patient is agreeable to proceed.  Consent signed and in chart.  Procedure scheduled emergently for today    Thank you for this interesting consult.  I greatly enjoyed meeting DYSHAWN LELLI and look forward to participating in their care.  A copy of this report was sent to the requesting provider on this date.  Electronically Signed: D. Rowe Robert, PA-C 05/21/2021, 12:01 PM   I spent a total of 40 Minutes    in face to face in clinical consultation, greater than 50% of which was counseling/coordinating care for visceral arteriogram with possible embolization

## 2021-05-22 ENCOUNTER — Observation Stay (HOSPITAL_COMMUNITY): Payer: Medicare HMO

## 2021-05-22 DIAGNOSIS — I10 Essential (primary) hypertension: Secondary | ICD-10-CM | POA: Diagnosis not present

## 2021-05-22 DIAGNOSIS — M1990 Unspecified osteoarthritis, unspecified site: Secondary | ICD-10-CM | POA: Diagnosis present

## 2021-05-22 DIAGNOSIS — Z7982 Long term (current) use of aspirin: Secondary | ICD-10-CM | POA: Diagnosis not present

## 2021-05-22 DIAGNOSIS — K922 Gastrointestinal hemorrhage, unspecified: Secondary | ICD-10-CM | POA: Diagnosis present

## 2021-05-22 DIAGNOSIS — Z87442 Personal history of urinary calculi: Secondary | ICD-10-CM | POA: Diagnosis not present

## 2021-05-22 DIAGNOSIS — E782 Mixed hyperlipidemia: Secondary | ICD-10-CM | POA: Diagnosis not present

## 2021-05-22 DIAGNOSIS — D125 Benign neoplasm of sigmoid colon: Secondary | ICD-10-CM | POA: Diagnosis not present

## 2021-05-22 DIAGNOSIS — K640 First degree hemorrhoids: Secondary | ICD-10-CM | POA: Diagnosis not present

## 2021-05-22 DIAGNOSIS — N400 Enlarged prostate without lower urinary tract symptoms: Secondary | ICD-10-CM | POA: Diagnosis present

## 2021-05-22 DIAGNOSIS — Z720 Tobacco use: Secondary | ICD-10-CM | POA: Diagnosis not present

## 2021-05-22 DIAGNOSIS — D124 Benign neoplasm of descending colon: Secondary | ICD-10-CM | POA: Diagnosis not present

## 2021-05-22 DIAGNOSIS — Z974 Presence of external hearing-aid: Secondary | ICD-10-CM | POA: Diagnosis not present

## 2021-05-22 DIAGNOSIS — K635 Polyp of colon: Secondary | ICD-10-CM | POA: Diagnosis not present

## 2021-05-22 DIAGNOSIS — H9190 Unspecified hearing loss, unspecified ear: Secondary | ICD-10-CM | POA: Diagnosis not present

## 2021-05-22 DIAGNOSIS — E0590 Thyrotoxicosis, unspecified without thyrotoxic crisis or storm: Secondary | ICD-10-CM | POA: Diagnosis not present

## 2021-05-22 DIAGNOSIS — K921 Melena: Secondary | ICD-10-CM | POA: Diagnosis not present

## 2021-05-22 DIAGNOSIS — K5731 Diverticulosis of large intestine without perforation or abscess with bleeding: Secondary | ICD-10-CM | POA: Diagnosis not present

## 2021-05-22 DIAGNOSIS — Z79899 Other long term (current) drug therapy: Secondary | ICD-10-CM | POA: Diagnosis not present

## 2021-05-22 DIAGNOSIS — K625 Hemorrhage of anus and rectum: Secondary | ICD-10-CM | POA: Diagnosis not present

## 2021-05-22 DIAGNOSIS — D62 Acute posthemorrhagic anemia: Secondary | ICD-10-CM | POA: Diagnosis not present

## 2021-05-22 DIAGNOSIS — K5909 Other constipation: Secondary | ICD-10-CM | POA: Diagnosis present

## 2021-05-22 DIAGNOSIS — U071 COVID-19: Secondary | ICD-10-CM | POA: Diagnosis not present

## 2021-05-22 DIAGNOSIS — I2510 Atherosclerotic heart disease of native coronary artery without angina pectoris: Secondary | ICD-10-CM | POA: Diagnosis not present

## 2021-05-22 LAB — COMPREHENSIVE METABOLIC PANEL
ALT: 17 U/L (ref 0–44)
AST: 20 U/L (ref 15–41)
Albumin: 2.8 g/dL — ABNORMAL LOW (ref 3.5–5.0)
Alkaline Phosphatase: 51 U/L (ref 38–126)
Anion gap: 9 (ref 5–15)
BUN: 21 mg/dL (ref 8–23)
CO2: 21 mmol/L — ABNORMAL LOW (ref 22–32)
Calcium: 7.9 mg/dL — ABNORMAL LOW (ref 8.9–10.3)
Chloride: 112 mmol/L — ABNORMAL HIGH (ref 98–111)
Creatinine, Ser: 1.02 mg/dL (ref 0.61–1.24)
GFR, Estimated: >60 mL/min (ref >60)
Glucose, Bld: 106 mg/dL — ABNORMAL HIGH (ref 70–99)
Potassium: 4.2 mmol/L (ref 3.5–5.1)
Sodium: 142 mmol/L (ref 135–145)
Total Bilirubin: 1.4 mg/dL — ABNORMAL HIGH (ref 0.3–1.2)
Total Protein: 5.2 g/dL — ABNORMAL LOW (ref 6.5–8.1)

## 2021-05-22 LAB — CBC
HCT: 27 % — ABNORMAL LOW (ref 39.0–52.0)
Hemoglobin: 9 g/dL — ABNORMAL LOW (ref 13.0–17.0)
MCH: 30.6 pg (ref 26.0–34.0)
MCHC: 33.3 g/dL (ref 30.0–36.0)
MCV: 91.8 fL (ref 80.0–100.0)
Platelets: 258 10*3/uL (ref 150–400)
RBC: 2.94 MIL/uL — ABNORMAL LOW (ref 4.22–5.81)
RDW: 14.8 % (ref 11.5–15.5)
WBC: 12.6 10*3/uL — ABNORMAL HIGH (ref 4.0–10.5)
nRBC: 0 % (ref 0.0–0.2)

## 2021-05-22 LAB — C-REACTIVE PROTEIN: CRP: 0.5 mg/dL (ref <1.0)

## 2021-05-22 LAB — D-DIMER, QUANTITATIVE: D-Dimer, Quant: 0.55 ug/mL-FEU — ABNORMAL HIGH (ref 0.00–0.50)

## 2021-05-22 MED ORDER — PEG-KCL-NACL-NASULF-NA ASC-C 100 G PO SOLR
0.5000 | Freq: Once | ORAL | Status: AC
  Administered 2021-05-23: 100 g via ORAL
  Filled 2021-05-22: qty 1

## 2021-05-22 NOTE — Progress Notes (Signed)
PROGRESS NOTE   Jaime [name]  [id number] DOB: 1933-01-20 DOA: 05/20/2021 PCP: Redmond School, MD  Brief Narrative:  87 year old male BPH, CAD, prior diverticulosis and polyps (has not had a colonoscopy in many years) COVID-positive this admit  admitted with probable diverticular bleed from an Hospital because CT abdomen pelvis showed possible bleeding-emergency room coordinated--transferred to [facility] for IR to see and consider embolization   IR has seen him and performed a CT angio that was negative for acute bleeding  Transfused 1 unit of blood   Hospital-Problem based course  Probably diverticular bleed IMA angiography negative for arterial bleed no longer taking aspirin Continue bowel prep--n.p.o. except for meds--effluent is not clear yet --dark blood  continue IV fluid with 25 cc/H, continue Protonix 40 IV twice daily For eventual colonoscopy per GI--blk f Asymptomatic COVID-19-not requiring oxygen D-dimer 0.55 Continue remdesivir and complete Asymptomatic CAD Continue statin in the outpatient setting  DVT prophylaxis: SCD Code Status: Full Family Communication: Discussed with Juliann Pulse at bedside [phone] Disposition:  Status is: Observation  The patient will require care spanning > 2 midnights and should be moved to inpatient because: Hemodynamically unstable, Ongoing diagnostic testing needed not appropriate for outpatient work up, and Unsafe d/c plan  Dispo: The patient is from: Home              Anticipated d/c is to:  Home with home health potentially              Patient currently is not medically stable to d/c.   Difficult to place patient No       Consultants:  Gastroenterology IR  Procedures:   IMA angiography performed 9/4  Antimicrobials:     Subjective: Awake coherent no distress Continues with liquid dark stool effluent is not clear No wheeze no chills no rigors no shortness of breath out of the ordinary   Objective: Vitals:    05/21/21 1546 05/21/21 2141 05/22/21 0100 05/22/21 0436  BP: (!) 106/56 129/66 107/63 119/67  Pulse: 88 92 94 87  Resp: _0 Temp: 98.7 F (37.1 C) 98.5 F (36.9 C) 97.6 F (36.4 C) 97.9 F (36.6 C)  TempSrc: Oral Oral    SpO2: 97% 97% 96% 96%  Weight:      Height:        Intake/Output Summary (Last 24 hours) at 05/22/2021 0921 Last data filed at 05/22/2021 0900 Gross per 24 hour  Intake 1129.39 ml  Output --  Net 1129.39 ml   Filed Weights   05/20/21 2239  Weight: 68.9 kg    Examination:  Awake coherent pleasant on room air no distress CTA B S1-S2 no murmur no rub no gallop Abdomen soft no rebound no guarding  no lower extremity edema Skin intact    Data Reviewed: personally reviewed   CBC    Component Value Date/Time   WBC 12.6 (H) 05/22/2021 0115   RBC 2.94 (L) 05/22/2021 0115   HGB 9.0 (L) 05/22/2021 0115   HGB WILL FOLLOW 08/28/2019 0809   HCT 27.0 (L) 05/22/2021 0115   HCT WILL FOLLOW 08/28/2019 0809   PLT 258 05/22/2021 0115   PLT WILL FOLLOW 08/28/2019 0809   MCV 91.8 05/22/2021 0115   MCV WILL FOLLOW 08/28/2019 0809   MCH 30.6 05/22/2021 0115   MCHC 33.3 05/22/2021 0115   RDW 14.8 05/22/2021 0115   RDW WILL FOLLOW 08/28/2019 0809   LYMPHSABS 2.1 09/14/2020 1431   LYMPHSABS WILL FOLLOW 08/28/2019 0809  MONOABS 1.1 (H) 09/14/2020 1431   EOSABS 0.5 09/14/2020 1431   EOSABS WILL FOLLOW 08/28/2019 0809   BASOSABS 0.1 09/14/2020 1431   BASOSABS WILL FOLLOW 08/28/2019 0809   CMP Latest Ref Rng & Units 05/22/2021 05/21/2021 05/20/2021  Glucose 70 - 99 mg/dL 106(H) 105(H) 108(H)  BUN 8 - 23 mg/dL _0 Creatinine 0.61 - 1.24 mg/dL 1.02 0.87 0.96  Sodium 135 - 145 mmol/L 142 143 140  Potassium 3.5 - 5.1 mmol/L 4.2 4.1 3.8  Chloride 98 - 111 mmol/L 112(H) 113(H) 109  CO2 22 - 32 mmol/L 21(L) 25 25  Calcium 8.9 - 10.3 mg/dL 7.9(L) 7.7(L) 8.3(L)  Total Protein 6.5 - 8.1 g/dL 5.2(L) - 7.3  Total Bilirubin 0.3 - 1.2 mg/dL 1.4(H) - 1.0  Alkaline  Phos 38 - 126 U/L 51 - 73  AST 15 - 41 U/L 20 - 29  ALT 0 - 44 U/L 17 - 27     Radiology Studies: IR Angiogram Visceral Selective  Result Date: 05/21/2021 INDICATION: Acute left lower quadrant proximal sigmoid diverticular GI bleed EXAM: Ultrasound guidance for vascular access Selective IMA catheterization and angiogram MEDICATIONS: 1% lidocaine local. ANESTHESIA/SEDATION: Moderate (conscious) sedation was employed during this procedure. A total of Versed 1.0 mg and Fentanyl 25 mcg was administered intravenously. Moderate Sedation Time: 31 minutes. The patient's level of consciousness and vital signs were monitored continuously by radiology nursing throughout the procedure under my direct supervision. CONTRAST:  75 cc omni 300 FLUOROSCOPY TIME:  Fluoroscopy Time: 2 minutes 48 seconds (195 mGy). COMPLICATIONS: None immediate. PROCEDURE: Informed consent was obtained from the patient following explanation of the procedure, risks, benefits and alternatives. The patient understands, agrees and consents for the procedure. All questions were addressed. A time out was performed prior to the initiation of the procedure. Maximal barrier sterile technique utilized including caps, mask, sterile gowns, sterile gloves, large sterile drape, hand hygiene, and Betadine prep. Under sterile conditions and local anesthesia, ultrasound micropuncture access performed the right common femoral artery. Five French sheath inserted over a Bentson guidewire. A Sos Omni select catheter was advanced over a Bentson guidewire and reformed in the aorta. Selective catheterization of the IMA performed. IMA angiogram performed. IMA: IMA and its branches are widely patent. No active arterial bleeding in the left lower quadrant proximal sigmoid colon to correlate with the CTA finding. IMA angiography was repeated over two 5 minute intervals without any evidence of intermittent active arterial bleeding. On the venous phase, there is some  tortuosity of the upper rectal draining veins into the patent IMV noted. Access removed. Hemostasis obtained with manual compression. No immediate complication. Patient tolerated the procedure well. IMPRESSION: Negative IMA angiogram for active arterial bleeding. Electronically Signed   By: Jerilynn Mages.  Shick M.D.   On: 05/21/2021 13:47   IR US Guide Vasc Access Right  Result Date: 05/21/2021 INDICATION: Acute left lower quadrant proximal sigmoid diverticular GI bleed EXAM: Ultrasound guidance for vascular access Selective IMA catheterization and angiogram MEDICATIONS: 1% lidocaine local. ANESTHESIA/SEDATION: Moderate (conscious) sedation was employed during this procedure. A total of Versed 1.0 mg and Fentanyl 25 mcg was administered intravenously. Moderate Sedation Time: 31 minutes. The patient's level of consciousness and vital signs were monitored continuously by radiology nursing throughout the procedure under my direct supervision. CONTRAST:  75 cc omni 300 FLUOROSCOPY TIME:  Fluoroscopy Time: 2 minutes 48 seconds (195 mGy). COMPLICATIONS: None immediate. PROCEDURE: Informed consent was obtained from the patient following explanation of the procedure, risks, benefits  and alternatives. The patient understands, agrees and consents for the procedure. All questions were addressed. A time out was performed prior to the initiation of the procedure. Maximal barrier sterile technique utilized including caps, mask, sterile gowns, sterile gloves, large sterile drape, hand hygiene, and Betadine prep. Under sterile conditions and local anesthesia, ultrasound micropuncture access performed the right common femoral artery. Five French sheath inserted over a Bentson guidewire. A Sos Omni select catheter was advanced over a Bentson guidewire and reformed in the aorta. Selective catheterization of the IMA performed. IMA angiogram performed. IMA: IMA and its branches are widely patent. No active arterial bleeding in the left lower  quadrant proximal sigmoid colon to correlate with the CTA finding. IMA angiography was repeated over two 5 minute intervals without any evidence of intermittent active arterial bleeding. On the venous phase, there is some tortuosity of the upper rectal draining veins into the patent IMV noted. Access removed. Hemostasis obtained with manual compression. No immediate complication. Patient tolerated the procedure well. IMPRESSION: Negative IMA angiogram for active arterial bleeding. Electronically Signed   By: Jerilynn Mages.  Shick M.D.   On: 05/21/2021 13:47   DG Chest Port 1 View  Result Date: 05/20/2021 CLINICAL DATA:  Shortness of breath EXAM: PORTABLE CHEST 1 VIEW COMPARISON:  09/14/2020 FINDINGS: Stable chronic elevation of the right hemidiaphragm. Chronic changes in the lungs with scarring. Heart is normal size. No acute opacities or effusions. IMPRESSION: Stable chronic elevation of the right hemidiaphragm. Stable chronic interstitial prominence, likely chronic lung disease/scarring. Electronically Signed   By: Rolm Baptise M.D.   On: 05/20/2021 23:40   CT Angio Abd/Pel W and/or Wo Contrast  Result Date: 05/21/2021 CLINICAL DATA:  Rectal bleeding EXAM: CTA ABDOMEN AND PELVIS WITHOUT AND WITH CONTRAST TECHNIQUE: Multidetector CT imaging of the abdomen and pelvis was performed using the standard protocol during bolus administration of intravenous contrast. Multiplanar reconstructed images and MIPs were obtained and reviewed to evaluate the vascular anatomy. CONTRAST:  154m OMNIPAQUE IOHEXOL 350 MG/ML SOLN COMPARISON:  09/21/2017 FINDINGS: VASCULAR Aorta: Heavily calcified aorta.  No aneurysm.  No dissection. Celiac: Patent without evidence of aneurysm, dissection, vasculitis or significant stenosis. SMA: Patent without evidence of aneurysm, dissection, vasculitis or significant stenosis. Renals: Both renal arteries are patent without evidence of aneurysm, dissection, vasculitis, fibromuscular dysplasia or significant  stenosis. IMA: Patent without evidence of aneurysm, dissection, vasculitis or significant stenosis. Inflow: Patent without evidence of aneurysm, dissection, vasculitis or significant stenosis. Proximal Outflow: Bilateral common femoral and visualized portions of the superficial and profunda femoral arteries are patent without evidence of aneurysm, dissection, vasculitis or significant stenosis. Veins: No obvious venous abnormality within the limitations of this arterial phase study. Review of the MIP images confirms the above findings. NON-VASCULAR Lower chest: No acute abnormality Hepatobiliary: No focal hepatic abnormality. Gallbladder unremarkable. Pancreas: No focal abnormality or ductal dilatation. Spleen: No focal abnormality.  Normal size. Adrenals/Urinary Tract: Small cysts in the kidneys bilaterally. No stones or hydronephrosis. Adrenal glands and urinary bladder unremarkable. Stomach/Bowel: Diffuse colonic diverticulosis. There is contrast seen within the proximal sigmoid colon, likely active bleeding from a diverticulum. No bowel obstruction. Stomach and small bowel decompressed, grossly unremarkable. Lymphatic: No adenopathy Reproductive: Prostate enlargement Other: No free fluid or free air. Musculoskeletal: No acute bony abnormality. IMPRESSION: VASCULAR Contrast extravasation noted in the proximal sigmoid colon, likely source of GI bleed from a diverticulum. Aortic atherosclerosis. NON-VASCULAR Diffuse colonic diverticulosis.  No active diverticulitis. Prostate enlargement. Electronically Signed   By: KRolm BaptiseM.D.  On: 05/21/2021 01:55     Scheduled Meds:  sodium chloride   Intravenous Once   vitamin C  500 mg Oral Daily   pantoprazole (PROTONIX) IV  40 mg Intravenous Q12H   peg 3350 powder  1 kit Oral BID   zinc sulfate  220 mg Oral Daily   Continuous Infusions:  sodium chloride 125 mL/hr at 05/22/21 0456   remdesivir 100 mg in NS 100 mL       LOS: 0 days   Time spent:  [place], MD [facility] To contact the attending provider between 7A-7P or the covering provider during after hours 7P-7A, please log into the web site [email or web address] and access using universal [place] password for that web site. If you do not have the password, please call the hospital operator.  05/22/2021, 9:21 AM

## 2021-05-22 NOTE — H&P (View-Only) (Signed)
Unfortunately we are cancelling his colonsocopy until tomorrow. His prep is not clear (stool, not blood) and so this will actually give time to have extra prep tomorrow AM. Clear liquids till then.  I discussed this with him and daughter (I think) in his room and they are very understanding.

## 2021-05-22 NOTE — Progress Notes (Signed)
Unfortunately we are cancelling his colonsocopy until tomorrow. His prep is not clear (stool, not blood) and so this will actually give time to have extra prep tomorrow AM. Clear liquids till then.  I discussed this with him and daughter (I think) in his room and they are very understanding.

## 2021-05-22 NOTE — Progress Notes (Signed)
Pt finished the first bottle of moviprep but still working on the second bottle at this time. Pt stated he feels sick taking it. Nausea med given to help.  Pt still has mashy to liquidy but bloody stool at this time.

## 2021-05-23 ENCOUNTER — Encounter (HOSPITAL_COMMUNITY): Payer: Self-pay | Admitting: Family Medicine

## 2021-05-23 ENCOUNTER — Encounter (HOSPITAL_COMMUNITY)
Admission: EM | Disposition: A | Discharge status: Home / Self Care | Payer: Self-pay | Source: Home / Self Care | Attending: Family Medicine

## 2021-05-23 ENCOUNTER — Inpatient Hospital Stay (HOSPITAL_COMMUNITY): Payer: Medicare HMO | Admitting: Certified Registered"

## 2021-05-23 DIAGNOSIS — K635 Polyp of colon: Secondary | ICD-10-CM

## 2021-05-23 HISTORY — PX: COLONOSCOPY WITH PROPOFOL: SHX5780

## 2021-05-23 HISTORY — PX: POLYPECTOMY: SHX5525

## 2021-05-23 LAB — COMPREHENSIVE METABOLIC PANEL
ALT: 16 U/L (ref 0–44)
AST: 23 U/L (ref 15–41)
Albumin: 2.5 g/dL — ABNORMAL LOW (ref 3.5–5.0)
Alkaline Phosphatase: 45 U/L (ref 38–126)
Anion gap: 5 (ref 5–15)
BUN: 17 mg/dL (ref 8–23)
CO2: 24 mmol/L (ref 22–32)
Calcium: 7.6 mg/dL — ABNORMAL LOW (ref 8.9–10.3)
Chloride: 112 mmol/L — ABNORMAL HIGH (ref 98–111)
Creatinine, Ser: 1 mg/dL (ref 0.61–1.24)
GFR, Estimated: >60 mL/min (ref >60)
Glucose, Bld: 89 mg/dL (ref 70–99)
Potassium: 3.4 mmol/L — ABNORMAL LOW (ref 3.5–5.1)
Sodium: 141 mmol/L (ref 135–145)
Total Bilirubin: 1 mg/dL (ref 0.3–1.2)
Total Protein: 5 g/dL — ABNORMAL LOW (ref 6.5–8.1)

## 2021-05-23 LAB — C-REACTIVE PROTEIN: CRP: 0.6 mg/dL (ref <1.0)

## 2021-05-23 LAB — CBC WITH DIFFERENTIAL/PLATELET
Abs Immature Granulocytes: 0.07 10*3/uL (ref 0.00–0.07)
Basophils Absolute: 0 10*3/uL (ref 0.0–0.1)
Basophils Relative: 0 %
Eosinophils Absolute: 1 10*3/uL — ABNORMAL HIGH (ref 0.0–0.5)
Eosinophils Relative: 9 %
HCT: 23 % — ABNORMAL LOW (ref 39.0–52.0)
Hemoglobin: 7.5 g/dL — ABNORMAL LOW (ref 13.0–17.0)
Immature Granulocytes: 1 %
Lymphocytes Relative: 18 %
Lymphs Abs: 2 10*3/uL (ref 0.7–4.0)
MCH: 30 pg (ref 26.0–34.0)
MCHC: 32.6 g/dL (ref 30.0–36.0)
MCV: 92 fL (ref 80.0–100.0)
Monocytes Absolute: 0.9 10*3/uL (ref 0.1–1.0)
Monocytes Relative: 8 %
Neutro Abs: 7.1 10*3/uL (ref 1.7–7.7)
Neutrophils Relative %: 64 %
Platelets: 257 10*3/uL (ref 150–400)
RBC: 2.5 MIL/uL — ABNORMAL LOW (ref 4.22–5.81)
RDW: 14.4 % (ref 11.5–15.5)
WBC: 11 10*3/uL — ABNORMAL HIGH (ref 4.0–10.5)
nRBC: 0 % (ref 0.0–0.2)

## 2021-05-23 LAB — TYPE AND SCREEN
ABO/RH(D): A POS
Antibody Screen: NEGATIVE
Unit division: 0

## 2021-05-23 LAB — BPAM RBC
Blood Product Expiration Date: 202210012359
Unit Type and Rh: 6200

## 2021-05-23 LAB — PREPARE RBC (CROSSMATCH)

## 2021-05-23 LAB — D-DIMER, QUANTITATIVE: D-Dimer, Quant: 0.7 ug/mL-FEU — ABNORMAL HIGH (ref 0.00–0.50)

## 2021-05-23 SURGERY — COLONOSCOPY WITH PROPOFOL
Anesthesia: Monitor Anesthesia Care

## 2021-05-23 MED ORDER — LACTATED RINGERS IV SOLN: INTRAVENOUS | Status: DC

## 2021-05-23 MED ORDER — PROPOFOL 10 MG/ML IV BOLUS
INTRAVENOUS | Status: DC | PRN
  Administered 2021-05-23 (×2): 30 mg via INTRAVENOUS
  Administered 2021-05-23 (×2): 20 mg via INTRAVENOUS

## 2021-05-23 MED ORDER — SODIUM CHLORIDE 0.9 % IV SOLN: INTRAVENOUS | Status: DC

## 2021-05-23 MED ORDER — MELATONIN 3 MG PO TABS
3.0000 mg | ORAL_TABLET | Freq: Once | ORAL | Status: AC
  Administered 2021-05-24: 3 mg via ORAL
  Filled 2021-05-23: qty 1

## 2021-05-23 MED ORDER — PROPOFOL 500 MG/50ML IV EMUL
INTRAVENOUS | Status: DC | PRN
  Administered 2021-05-23: 75 ug/kg/min via INTRAVENOUS

## 2021-05-23 SURGICAL SUPPLY — 22 items

## 2021-05-23 NOTE — Anesthesia Postprocedure Evaluation (Signed)
Anesthesia Post Note  Patient: COE ANGELOS  Procedure(s) Performed: COLONOSCOPY WITH PROPOFOL POLYPECTOMY     Patient location during evaluation: Endoscopy Anesthesia Type: MAC Level of consciousness: awake and alert Pain management: pain level controlled Vital Signs Assessment: post-procedure vital signs reviewed and stable Respiratory status: spontaneous breathing, nonlabored ventilation and respiratory function stable Cardiovascular status: blood pressure returned to baseline and stable Postop Assessment: no apparent nausea or vomiting Anesthetic complications: no   No notable events documented.  Last Vitals:  Vitals:   05/23/21 1532 05/23/21 1545  BP: 128/63 126/62  Pulse: 90 90  Resp: 16 17  Temp:  (!) 36.4 C  SpO2: 99% 98%    Last Pain:  Vitals:   05/23/21 1545  TempSrc: Axillary  PainSc:                  Merlinda Frederick

## 2021-05-23 NOTE — Transfer of Care (Signed)
Immediate Anesthesia Transfer of Care Note  Patient: Jaime [name]  Procedure(s) Performed: COLONOSCOPY WITH PROPOFOL POLYPECTOMY  Patient Location: Endoscopy Unit. Remains in Endo 1 for covid recovery by RN.  Anesthesia Type:MAC  Level of Consciousness: drowsy  Airway & Oxygen Therapy: Patient Spontanous Breathing and Patient connected to face mask oxygen  Post-op Assessment: Report given to RN and Post -op Vital signs reviewed and stable  Post vital signs: Reviewed and stable  Last Vitals:  Vitals Value Taken Time  BP 101/71   Temp    Pulse 90   Resp    SpO2 100     Last Pain:  Vitals:   05/23/21 1404  TempSrc: Temporal  PainSc: 0-No pain         Complications: No notable events documented.

## 2021-05-23 NOTE — Plan of Care (Signed)

## 2021-05-23 NOTE — Anesthesia Procedure Notes (Signed)
Procedure Name: MAC Date/Time: 05/23/2021 2:36 PM Performed by: Carolan Clines, CRNA Pre-anesthesia Checklist: Patient identified, Emergency Drugs available, Suction available and Patient being monitored Patient Re-evaluated:Patient Re-evaluated prior to induction Oxygen Delivery Method: Simple face mask Dental Injury: Teeth and Oropharynx as per pre-operative assessment

## 2021-05-23 NOTE — Op Note (Signed)
[facility] Patient Name: Jaime [name] Procedure Date : 05/22/2021 MRN: [id number] Attending MD: Jackquline Denmark , MD Date of Birth: 12/03/1932 CSN: [id number] Age: 87 Admit Type: Inpatient Procedure:                Colonoscopy Indications:              Painless hematochezia. Neg CTA. Providers:                Jackquline Denmark, MD, Mariana Arn, Tyna Jaksch                            Technician Referring MD:             Dr Ardis Hughs Medicines:                Monitored Anesthesia Care Complications:            No immediate complications. Estimated Blood Loss:     Estimated blood loss: none. Procedure:                Pre-Anesthesia Assessment:                           - Prior to the procedure, a History and Physical                            was performed, and patient medications and                            allergies were reviewed. The patient's tolerance of                            previous anesthesia was also reviewed. The risks                            and benefits of the procedure and the sedation                            options and risks were discussed with the patient.                            All questions were answered, and informed consent                            was obtained. Prior Anticoagulants: The patient has                            taken no previous anticoagulant or antiplatelet                            agents. ASA Grade Assessment: III - A patient with                            severe systemic disease. After reviewing the risks  and benefits, the patient was deemed in                            satisfactory condition to undergo the procedure.                           After obtaining informed consent, the colonoscope                            was passed under direct vision. Throughout the                            procedure, the patient's blood pressure, pulse, and                            oxygen saturations  were monitored continuously. The                            [id number] Olympus colonoscope was                            introduced through the anus and advanced to the the                            cecum, identified by appendiceal orifice and                            ileocecal valve. The colonoscopy was somewhat                            difficult due to a redundant colon. Successful                            completion of the procedure was aided by applying                            abdominal pressure. The patient tolerated the                            procedure well. The quality of the bowel                            preparation was fair despite 2-day prep. Of note                            that small and flat lesions could have been missed.                            Aggressive suctioning aspiration was performed.                            Overall over 75 to 80% of the colonic mucosa was  visualized satisfactorily. The ileocecal valve,                            appendiceal orifice, and rectum were photographed. Scope In: 2:37:59 PM Scope Out: 3:09:45 PM Scope Withdrawal Time: 0 hours 16 minutes 30 seconds  Total Procedure Duration: 0 hours 31 minutes 46 seconds  Findings:      Two sessile polyps were found in the distal sigmoid colon and mid       descending colon. The polyps were 6 mm in size. These polyps were       removed with a cold snare. Resection and retrieval were complete.      Multiple medium-mouthed diverticula were found in the sigmoid colon,       descending colon, ascending colon and cecum.      Non-bleeding internal hemorrhoids were found during retroflexion. The       hemorrhoids were moderate and Grade I (internal hemorrhoids that do not       prolapse).      The exam was otherwise without abnormality on direct and retroflexion       views. Impression:               - Patient likely had a diverticular bleed. No                             active bleeding.                           - Two 6 mm polyps in the distal sigmoid colon and                            in the mid descending colon, removed with a cold                            snare. Resected and retrieved.                           - Diverticulosis in the sigmoid colon, in the                            descending colon, in the ascending colon and in the                            cecum.                           - Non-bleeding internal hemorrhoids.                           - The examination was otherwise normal on direct                            and retroflexion views. The colonoscopy was to some                            extent limited d/t quality of preparation. No  circumferential lesions. Recommendation:           - Patient has a contact number available for                            emergencies. The signs and symptoms of potential                            delayed complications were discussed with the                            patient. Return to normal activities tomorrow.                            Written discharge instructions were provided to the                            patient.                           - High fiber diet.                           - Continue present medications.                           - Await pathology results.                           - Repeat colonoscopy is not recommended for                            screening purposes.                           - The findings and recommendations were discussed                            with the patient's family.                           - Trend CBC. If stable, can D/C home tomorrow. Procedure Code(s):        --- Professional ---                           [phone], Colonoscopy, flexible; with removal of                            tumor(s), polyp(s), or other lesion(s) by snare                            technique Diagnosis Code(s):         --- Professional ---                           K64.0, First degree hemorrhoids  K63.5, Polyp of colon                           K62.5, Hemorrhage of anus and rectum                           K57.30, Diverticulosis of large intestine without                            perforation or abscess without bleeding CPT copyright 2019 American Medical Association. All rights reserved. The codes documented in this report are preliminary and upon coder review may  be revised to meet current compliance requirements. Jackquline Denmark, MD 05/23/2021 3:20:00 PM This report has been signed electronically. Number of Addenda: 0

## 2021-05-23 NOTE — Anesthesia Preprocedure Evaluation (Addendum)
Anesthesia Evaluation  Patient identified by MRN, date of birth, ID band Patient awake    Reviewed: Allergy & Precautions, NPO status , Patient's Chart, lab work & pertinent test results  History of Anesthesia Complications (+) AWARENESS UNDER ANESTHESIA  Airway Mallampati: II  TM Distance: >3 FB Neck ROM: Full    Dental no notable dental hx. (+) Partial Upper   Pulmonary shortness of breath, Recent URI  ( covid),    Pulmonary exam normal breath sounds clear to auscultation       Cardiovascular hypertension, + CAD   Rhythm:Regular Rate:Tachycardia     Neuro/Psych negative neurological ROS  negative psych ROS   GI/Hepatic Diverticular bleed   Endo/Other  Hyperthyroidism   Renal/GU Renal disease     Musculoskeletal  (+) Arthritis ,   Abdominal   Peds  Hematology  (+) anemia , hgb trended down to 7.5 from 9.0.    Anesthesia Other Findings   Reproductive/Obstetrics negative OB ROS                            Anesthesia Physical Anesthesia Plan  ASA: 3 and emergent  Anesthesia Plan: MAC   Post-op Pain Management:    Induction: Intravenous  PONV Risk Score and Plan: 2 and Treatment may vary due to age or medical condition  Airway Management Planned: Natural Airway and Nasal Cannula  Additional Equipment: None  Intra-op Plan:   Post-operative Plan:   Informed Consent: I have reviewed the patients History and Physical, chart, labs and discussed the procedure including the risks, benefits and alternatives for the proposed anesthesia with the patient or authorized representative who has indicated his/her understanding and acceptance.       Plan Discussed with: CRNA and Anesthesiologist  Anesthesia Plan Comments:        Anesthesia Quick Evaluation

## 2021-05-23 NOTE — Progress Notes (Signed)
PROGRESS NOTE   Jaime KOWITZ  [id number] DOB: 04-13-1933 DOA: 05/20/2021 PCP: Redmond School, MD  Brief Narrative:  87 year old male BPH, CAD, prior diverticulosis and polyps (has not had a colonoscopy in many years) COVID-positive this admit  admitted with probable diverticular bleed from an Hospital because CT abdomen pelvis showed possible bleeding-emergency room coordinated--transferred to [facility] for IR to see and consider embolization   IR has seen him and performed a CT angio that was negative for acute bleeding  Transfused 1 unit of blood   Hospital-Problem based course  Probably diverticular bleed IMA angiography negative for arterial bleed no longer taking aspirin Continue bowel prep--n.p.o. except for meds--effluent has cleared continue IV fluid with 125 cc/H, continue Protonix 40 IV twice daily Colonoscopy per GI Likely dilutional anemia Recheck hemoglobin in am--has component of dilutional anemia Transfuse if hemoglobin goes below 7 Asymptomatic COVID-19-not requiring oxygen D-dimer 0.55 Continue remdesivir on 9/7 Asymptomatic CAD Continue statin in the outpatient setting  DVT prophylaxis: SCD Code Status: Full Family Communication: Discussed with Juliann Pulse at bedside [phone] Disposition:  Status is: Observation  The patient will require care spanning > 2 midnights and should be moved to inpatient because: Hemodynamically unstable, Ongoing diagnostic testing needed not appropriate for outpatient work up, and Unsafe d/c plan  Dispo: The patient is from: Home              Anticipated d/c is to:  Home with home health potentially              Patient currently is not medically stable to d/c.   Difficult to place patient No       Consultants:  Gastroenterology IR  Procedures:   IMA angiography performed 9/4  Antimicrobials:     Subjective:  Coherent Less stool No cp fever n/v Oriented but a little HOH   Objective: Vitals:   05/22/21  1954 05/23/21 0524 05/23/21 0814 05/23/21 0815  BP: (!) 144/60 (!) 135/59 116/63 (!) 109/57  Pulse: 98 93 91 96  Resp: '17 17  18  '$ Temp: 98 F (36.7 C) 97.9 F (36.6 C) 97.7 F (36.5 C) 97.6 F (36.4 C)  TempSrc: Oral  Oral Oral  SpO2: 97% 97% 97%   Weight:      Height:        Intake/Output Summary (Last 24 hours) at 05/23/2021 0922 Last data filed at 05/23/2021 0230 Gross per 24 hour  Intake 1200 ml  Output 550 ml  Net 650 ml    Filed Weights   05/20/21 2239  Weight: 68.9 kg    Examination:   on room air no distress CTA B S1-S2 no murmur n Abdomen soft , non tender no lower extremity edema Skin intact    Data Reviewed: personally reviewed   CBC    Component Value Date/Time   WBC 11.0 (H) 05/23/2021 0039   RBC 2.50 (L) 05/23/2021 0039   HGB 7.5 (L) 05/23/2021 0039   HGB WILL FOLLOW 08/28/2019 0809   HCT 23.0 (L) 05/23/2021 0039   HCT WILL FOLLOW 08/28/2019 0809   PLT 257 05/23/2021 0039   PLT WILL FOLLOW 08/28/2019 0809   MCV 92.0 05/23/2021 0039   MCV WILL FOLLOW 08/28/2019 0809   MCH 30.0 05/23/2021 0039   MCHC 32.6 05/23/2021 0039   RDW 14.4 05/23/2021 0039   RDW WILL FOLLOW 08/28/2019 0809   LYMPHSABS 2.0 05/23/2021 0039   LYMPHSABS WILL FOLLOW 08/28/2019 0809   MONOABS 0.9 05/23/2021 0039   EOSABS  1.0 (H) 05/23/2021 0039   EOSABS WILL FOLLOW 08/28/2019 0809   BASOSABS 0.0 05/23/2021 0039   BASOSABS WILL FOLLOW 08/28/2019 0809   CMP Latest Ref Rng & Units 05/23/2021 05/22/2021 05/21/2021  Glucose 70 - 99 mg/dL 89 106(H) 105(H)  BUN 8 - 23 mg/dL '17 21 20  '$ Creatinine 0.61 - 1.24 mg/dL 1.00 1.02 0.87  Sodium 135 - 145 mmol/L 141 142 143  Potassium 3.5 - 5.1 mmol/L 3.4(L) 4.2 4.1  Chloride 98 - 111 mmol/L 112(H) 112(H) 113(H)  CO2 22 - 32 mmol/L 24 21(L) 25  Calcium 8.9 - 10.3 mg/dL 7.6(L) 7.9(L) 7.7(L)  Total Protein 6.5 - 8.1 g/dL 5.0(L) 5.2(L) -  Total Bilirubin 0.3 - 1.2 mg/dL 1.0 1.4(H) -  Alkaline Phos 38 - 126 U/L 45 51 -  AST 15 - 41 U/L 23  20 -  ALT 0 - 44 U/L 16 17 -     Radiology Studies: IR Angiogram Visceral Selective  Result Date: 05/21/2021 INDICATION: Acute left lower quadrant proximal sigmoid diverticular GI bleed EXAM: Ultrasound guidance for vascular access Selective IMA catheterization and angiogram MEDICATIONS: 1% lidocaine local. ANESTHESIA/SEDATION: Moderate (conscious) sedation was employed during this procedure. A total of Versed 1.0 mg and Fentanyl 25 mcg was administered intravenously. Moderate Sedation Time: 31 minutes. The patient's level of consciousness and vital signs were monitored continuously by radiology nursing throughout the procedure under my direct supervision. CONTRAST:  75 cc omni 300 FLUOROSCOPY TIME:  Fluoroscopy Time: 2 minutes 48 seconds (195 mGy). COMPLICATIONS: None immediate. PROCEDURE: Informed consent was obtained from the patient following explanation of the procedure, risks, benefits and alternatives. The patient understands, agrees and consents for the procedure. All questions were addressed. A time out was performed prior to the initiation of the procedure. Maximal barrier sterile technique utilized including caps, mask, sterile gowns, sterile gloves, large sterile drape, hand hygiene, and Betadine prep. Under sterile conditions and local anesthesia, ultrasound micropuncture access performed the right common femoral artery. Five French sheath inserted over a Bentson guidewire. A Sos Omni select catheter was advanced over a Bentson guidewire and reformed in the aorta. Selective catheterization of the IMA performed. IMA angiogram performed. IMA: IMA and its branches are widely patent. No active arterial bleeding in the left lower quadrant proximal sigmoid colon to correlate with the CTA finding. IMA angiography was repeated over two 5 minute intervals without any evidence of intermittent active arterial bleeding. On the venous phase, there is some tortuosity of the upper rectal draining veins into the  patent IMV noted. Access removed. Hemostasis obtained with manual compression. No immediate complication. Patient tolerated the procedure well. IMPRESSION: Negative IMA angiogram for active arterial bleeding. Electronically Signed   By: Jerilynn Mages.  Shick M.D.   On: 05/21/2021 13:47   IR US Guide Vasc Access Right  Result Date: 05/21/2021 INDICATION: Acute left lower quadrant proximal sigmoid diverticular GI bleed EXAM: Ultrasound guidance for vascular access Selective IMA catheterization and angiogram MEDICATIONS: 1% lidocaine local. ANESTHESIA/SEDATION: Moderate (conscious) sedation was employed during this procedure. A total of Versed 1.0 mg and Fentanyl 25 mcg was administered intravenously. Moderate Sedation Time: 31 minutes. The patient's level of consciousness and vital signs were monitored continuously by radiology nursing throughout the procedure under my direct supervision. CONTRAST:  75 cc omni 300 FLUOROSCOPY TIME:  Fluoroscopy Time: 2 minutes 48 seconds (195 mGy). COMPLICATIONS: None immediate. PROCEDURE: Informed consent was obtained from the patient following explanation of the procedure, risks, benefits and alternatives. The patient understands, agrees and  consents for the procedure. All questions were addressed. A time out was performed prior to the initiation of the procedure. Maximal barrier sterile technique utilized including caps, mask, sterile gowns, sterile gloves, large sterile drape, hand hygiene, and Betadine prep. Under sterile conditions and local anesthesia, ultrasound micropuncture access performed the right common femoral artery. Five French sheath inserted over a Bentson guidewire. A Sos Omni select catheter was advanced over a Bentson guidewire and reformed in the aorta. Selective catheterization of the IMA performed. IMA angiogram performed. IMA: IMA and its branches are widely patent. No active arterial bleeding in the left lower quadrant proximal sigmoid colon to correlate with the CTA  finding. IMA angiography was repeated over two 5 minute intervals without any evidence of intermittent active arterial bleeding. On the venous phase, there is some tortuosity of the upper rectal draining veins into the patent IMV noted. Access removed. Hemostasis obtained with manual compression. No immediate complication. Patient tolerated the procedure well. IMPRESSION: Negative IMA angiogram for active arterial bleeding. Electronically Signed   By: Jerilynn Mages.  Shick M.D.   On: 05/21/2021 13:47     Scheduled Meds:  sodium chloride   Intravenous Once   vitamin C  500 mg Oral Daily   pantoprazole (PROTONIX) IV  40 mg Intravenous Q12H   zinc sulfate  220 mg Oral Daily   Continuous Infusions:  sodium chloride 125 mL/hr at 05/22/21 0456   remdesivir 100 mg in NS 100 mL 100 mg (05/22/21 1141)     LOS: 1 day   Time spent: [place], MD [facility] To contact the attending provider between 7A-7P or the covering provider during after hours 7P-7A, please log into the web site [email or web address] and access using universal [place] password for that web site. If you do not have the password, please call the hospital operator.  05/23/2021, 9:22 AM

## 2021-05-23 NOTE — Plan of Care (Signed)
  Problem: Education: Goal: Knowledge of General Education information will improve Description: Including pain rating scale, medication(s)/side effects and non-pharmacologic comfort measures 05/23/2021 1808 by Tora Kindred, RN Outcome: Progressing 05/23/2021 1157 by Tora Kindred, RN Outcome: Progressing   Problem: Health Behavior/Discharge Planning: Goal: Ability to manage health-related needs will improve 05/23/2021 1808 by Tora Kindred, RN Outcome: Progressing 05/23/2021 1157 by Tora Kindred, RN Outcome: Progressing   Problem: Clinical Measurements: Goal: Ability to maintain clinical measurements within normal limits will improve 05/23/2021 1808 by Tora Kindred, RN Outcome: Progressing 05/23/2021 1157 by Tora Kindred, RN Outcome: Progressing Goal: Will remain free from infection 05/23/2021 1808 by Tora Kindred, RN Outcome: Progressing 05/23/2021 1157 by Tora Kindred, RN Outcome: Progressing Goal: Diagnostic test results will improve 05/23/2021 1808 by Tora Kindred, RN Outcome: Progressing 05/23/2021 1157 by Tora Kindred, RN Outcome: Progressing Goal: Respiratory complications will improve 05/23/2021 1808 by Tora Kindred, RN Outcome: Progressing 05/23/2021 1157 by Tora Kindred, RN Outcome: Progressing Goal: Cardiovascular complication will be avoided 05/23/2021 1808 by Tora Kindred, RN Outcome: Progressing 05/23/2021 1157 by Tora Kindred, RN Outcome: Progressing   Problem: Activity: Goal: Risk for activity intolerance will decrease 05/23/2021 1808 by Tora Kindred, RN Outcome: Progressing 05/23/2021 1157 by Tora Kindred, RN Outcome: Progressing   Problem: Nutrition: Goal: Adequate nutrition will be maintained 05/23/2021 1808 by Tora Kindred, RN Outcome: Progressing 05/23/2021 1157 by Tora Kindred, RN Outcome: Progressing   Problem: Coping: Goal: Level of anxiety will decrease 05/23/2021  1808 by Tora Kindred, RN Outcome: Progressing 05/23/2021 1157 by Tora Kindred, RN Outcome: Progressing   Problem: Elimination: Goal: Will not experience complications related to bowel motility 05/23/2021 1808 by Tora Kindred, RN Outcome: Progressing 05/23/2021 1157 by Tora Kindred, RN Outcome: Progressing Goal: Will not experience complications related to urinary retention 05/23/2021 1808 by Tora Kindred, RN Outcome: Progressing 05/23/2021 1157 by Tora Kindred, RN Outcome: Progressing   Problem: Pain Managment: Goal: General experience of comfort will improve 05/23/2021 1808 by Tora Kindred, RN Outcome: Progressing 05/23/2021 1157 by Tora Kindred, RN Outcome: Progressing   Problem: Safety: Goal: Ability to remain free from injury will improve 05/23/2021 1808 by Tora Kindred, RN Outcome: Progressing 05/23/2021 1157 by Tora Kindred, RN Outcome: Progressing   Problem: Skin Integrity: Goal: Risk for impaired skin integrity will decrease 05/23/2021 1808 by Tora Kindred, RN Outcome: Progressing 05/23/2021 1157 by Tora Kindred, RN Outcome: Progressing

## 2021-05-23 NOTE — Hospital Course (Signed)
Impression:               - Patient likely had a diverticular bleed. No                            active bleeding.                           - Two 6 mm polyps in the distal sigmoid colon and                            in the mid descending colon, removed with a cold                            snare. Resected and retrieved.                           - Diverticulosis in the sigmoid colon, in the                            descending colon, in the ascending colon and in the                            cecum.                           - Non-bleeding internal hemorrhoids.                           - The examination was otherwise normal on direct                            and retroflexion views. The colonoscopy was to some                            extent limited d/t quality of preparation. No                            circumferential lesions.

## 2021-05-23 NOTE — Interval H&P Note (Signed)
History and Physical Interval Note:  05/23/2021 2:10 PM  Jaime [name]  has presented today for surgery, with the diagnosis of lgi bleeding.  The various methods of treatment have been discussed with the patient and family. After consideration of risks, benefits and other options for treatment, the patient has consented to  Procedure(s): COLONOSCOPY WITH PROPOFOL (N/A) as a surgical intervention.  The patient's history has been reviewed, patient examined, no change in status, stable for surgery.  I have reviewed the patient's chart and labs.  Questions were answered to the patient's satisfaction.     Jackquline Denmark

## 2021-05-24 ENCOUNTER — Encounter (HOSPITAL_COMMUNITY): Payer: Self-pay | Admitting: Gastroenterology

## 2021-05-24 DIAGNOSIS — K625 Hemorrhage of anus and rectum: Secondary | ICD-10-CM | POA: Diagnosis not present

## 2021-05-24 LAB — CBC WITH DIFFERENTIAL/PLATELET
Abs Immature Granulocytes: 0.1 10*3/uL — ABNORMAL HIGH (ref 0.00–0.07)
Basophils Absolute: 0 10*3/uL (ref 0.0–0.1)
Basophils Relative: 0 %
Eosinophils Absolute: 1.9 10*3/uL — ABNORMAL HIGH (ref 0.0–0.5)
Eosinophils Relative: 13 %
HCT: 25 % — ABNORMAL LOW (ref 39.0–52.0)
Hemoglobin: 8.4 g/dL — ABNORMAL LOW (ref 13.0–17.0)
Immature Granulocytes: 1 %
Lymphocytes Relative: 13 %
Lymphs Abs: 1.9 10*3/uL (ref 0.7–4.0)
MCH: 30.9 pg (ref 26.0–34.0)
MCHC: 33.6 g/dL (ref 30.0–36.0)
MCV: 91.9 fL (ref 80.0–100.0)
Monocytes Absolute: 0.9 10*3/uL (ref 0.1–1.0)
Monocytes Relative: 6 %
Neutro Abs: 9.9 10*3/uL — ABNORMAL HIGH (ref 1.7–7.7)
Neutrophils Relative %: 67 %
Platelets: 261 10*3/uL (ref 150–400)
RBC: 2.72 MIL/uL — ABNORMAL LOW (ref 4.22–5.81)
RDW: 14.5 % (ref 11.5–15.5)
WBC: 14.7 10*3/uL — ABNORMAL HIGH (ref 4.0–10.5)
nRBC: 0 % (ref 0.0–0.2)

## 2021-05-24 LAB — COMPREHENSIVE METABOLIC PANEL
ALT: 19 U/L (ref 0–44)
AST: 28 U/L (ref 15–41)
Albumin: 2.5 g/dL — ABNORMAL LOW (ref 3.5–5.0)
Alkaline Phosphatase: 47 U/L (ref 38–126)
Anion gap: 4 — ABNORMAL LOW (ref 5–15)
BUN: 13 mg/dL (ref 8–23)
CO2: 25 mmol/L (ref 22–32)
Calcium: 7.6 mg/dL — ABNORMAL LOW (ref 8.9–10.3)
Chloride: 112 mmol/L — ABNORMAL HIGH (ref 98–111)
Creatinine, Ser: 1.22 mg/dL (ref 0.61–1.24)
GFR, Estimated: 58 mL/min — ABNORMAL LOW (ref >60)
Glucose, Bld: 102 mg/dL — ABNORMAL HIGH (ref 70–99)
Potassium: 3.4 mmol/L — ABNORMAL LOW (ref 3.5–5.1)
Sodium: 141 mmol/L (ref 135–145)
Total Bilirubin: 1 mg/dL (ref 0.3–1.2)
Total Protein: 4.7 g/dL — ABNORMAL LOW (ref 6.5–8.1)

## 2021-05-24 LAB — D-DIMER, QUANTITATIVE: D-Dimer, Quant: 0.78 ug/mL-FEU — ABNORMAL HIGH (ref 0.00–0.50)

## 2021-05-24 LAB — C-REACTIVE PROTEIN: CRP: 0.8 mg/dL (ref <1.0)

## 2021-05-24 MED ORDER — FERROUS SULFATE 325 (65 FE) MG PO TABS: 325.0000 mg | ORAL_TABLET | Freq: Every day | ORAL | 3 refills | Status: DC

## 2021-05-24 MED ORDER — ASCORBIC ACID 500 MG PO TABS: 500.0000 mg | ORAL_TABLET | Freq: Every day | ORAL | 0 refills | Status: DC

## 2021-05-24 MED ORDER — DOCUSATE SODIUM 100 MG PO CAPS: 100.0000 mg | ORAL_CAPSULE | Freq: Every day | ORAL | 2 refills | Status: AC

## 2021-05-24 MED ORDER — GUAIFENESIN ER 600 MG PO TB12: 600.0000 mg | ORAL_TABLET | Freq: Two times a day (BID) | ORAL | 0 refills | Status: AC

## 2021-05-24 MED ORDER — ZINC SULFATE 220 (50 ZN) MG PO CAPS: 220.0000 mg | ORAL_CAPSULE | Freq: Every day | ORAL | 0 refills | Status: DC

## 2021-05-24 NOTE — Progress Notes (Signed)
     Progress Note    ASSESSMENT AND PLAN:   LGI bleed-likely diverticular. Neg CTA.  No further bleeding Diminutive colonic polyps s/p polypectomy  Plan: -Advance diet -Trend CBC -Consider p.o. iron at discharge. -For chronic constipation, recommend Colace 1 tablet p.o. QD.  If still constipated, MiraLAX 17 g p.o. once a day -We will sign off for now. -FU GI as needed -Discussed in detail with patient's daughter     SUBJECTIVE   Feels much better No further bleeding. No abdominal pain Tolerating p.o. well    OBJECTIVE:     Vital signs in last 24 hours: Temp:  [96.7 F (35.9 C)-98.4 F (36.9 C)] 98.4 F (36.9 C) (09/07 0458) Pulse Rate:  [74-91] 74 (09/07 0458) Resp:  [14-18] 16 (09/07 0458) BP: (103-133)/(58-70) 116/58 (09/07 0458) SpO2:  [95 %-100 %] 95 % (09/07 0458) Weight:  [68.9 kg] 68.9 kg (09/06 1404) Last BM Date: 05/23/21 General:   Alert, thin built male in NAD EENT:  Normal hearing, non icteric sclera, conjunctive pink.  Heart:  Regular rate and rhythm; no murmur.  No lower extremity edema   Pulm: Normal respiratory effort, lungs CTA bilaterally without wheezes or crackles. Abdomen:  Soft, nondistended, nontender.  Normal bowel sounds,.       Neurologic:  Alert and  oriented x4;  grossly normal neurologically. Psych:  Pleasant, cooperative.  Normal mood and affect.   Intake/Output from previous day: 09/06 0701 - 09/07 0700 In: [id number] [P.O.:600; I.V.:450; Blood:240] Out: 200 [Urine:200] Intake/Output this shift: No intake/output data recorded.  Lab Results: Recent Labs    05/22/21 0115 05/23/21 0039 05/24/21 0141  WBC 12.6* 11.0* 14.7*  HGB 9.0* 7.5* 8.4*  HCT 27.0* 23.0* 25.0*  PLT 258 257 261   BMET Recent Labs    05/22/21 0115 05/23/21 0039 05/24/21 0141  NA 142 141 141  K 4.2 3.4* 3.4*  CL 112* 112* 112*  CO2 21* 24 25  GLUCOSE 106* 89 102*  BUN '21 17 13  '$ CREATININE 1.02 1.00 1.22  CALCIUM 7.9* 7.6* 7.6*    LFT Recent Labs    05/24/21 0141  PROT 4.7*  ALBUMIN 2.5*  AST 28  ALT 19  ALKPHOS 47  BILITOT 1.0   PT/INR No results for input(s): LABPROT, INR in the last 72 hours. Hepatitis Panel No results for input(s): HEPBSAG, HCVAB, HEPAIGM, HEPBIGM in the last 72 hours.  No results found.   Principal Problem:   Bright red rectal bleeding Active Problems:   Mixed hyperlipidemia   Coronary atherosclerosis of native coronary artery   Hypocalcemia   Acute blood loss anemia   Hypertension   COVID-19 virus infection   GI bleed     LOS: 2 days     Carmell Austria, MD 05/24/2021, 9:40 AM [facility] [phone]

## 2021-05-24 NOTE — Plan of Care (Signed)

## 2021-05-24 NOTE — Progress Notes (Signed)
Discharge instructions explained and given to patient and his daughter. Two IV's removed. Patient assisted with getting dressed by his daughter. Patient will be wheeled down with his daughter by his side as she will be transporting him home.

## 2021-05-25 LAB — BPAM RBC
Blood Product Expiration Date: 202209282359
Blood Product Expiration Date: 202209282359
Blood Product Expiration Date: 202209292359
Blood Product Expiration Date: 202210012359
Blood Product Expiration Date: 202210022359
Blood Product Expiration Date: 202210022359
Blood Product Expiration Date: 202210022359
Blood Product Expiration Date: 202210022359
ISSUE DATE / TIME: 202209041512
ISSUE DATE / TIME: 202209041541
ISSUE DATE / TIME: 202209041822
ISSUE DATE / TIME: 202209042300
ISSUE DATE / TIME: 202209061131
ISSUE DATE / TIME: 202209071639
ISSUE DATE / TIME: 202209072159
ISSUE DATE / TIME: 202209080405
Unit Type and Rh: 5100
Unit Type and Rh: 5100
Unit Type and Rh: 5100
Unit Type and Rh: 6200
Unit Type and Rh: 6200
Unit Type and Rh: 6200
Unit Type and Rh: 6200
Unit Type and Rh: 6200

## 2021-05-25 LAB — TYPE AND SCREEN
ABO/RH(D): A POS
Antibody Screen: NEGATIVE
Unit division: 0
Unit division: 0
Unit division: 0
Unit division: 0
Unit division: 0
Unit division: 0
Unit division: 0
Unit division: 0

## 2021-05-25 LAB — SURGICAL PATHOLOGY

## 2021-05-29 NOTE — Discharge Summary (Signed)
[facility] Discharge Summary   Patient: Jaime [name] [id number]  PCP: Redmond School, MD  Date of admission: 05/20/2021   Date of discharge: 05/24/2021     Discharge Diagnoses:  Principal Problem:   Bright red rectal bleeding Active Problems:   Mixed hyperlipidemia   Coronary atherosclerosis of native coronary artery   Hypocalcemia   Acute blood loss anemia   Hypertension   COVID-19 virus infection   GI bleed  Admitted From: home Disposition:  Home   Recommendations for Outpatient Follow-up:  PCP: follow up with PCP in 1 week Follow up LABS/TEST:  repeat CBC and BMP   Follow-up Information     Redmond School, MD. Schedule an appointment as soon as possible for a visit in 1 week(s).   Specialty: Internal Medicine Why: with CBC and BMP Contact information: [address] [place] [phone]         [facility]. Call.   Specialty: Gastroenterology Why: As needed Contact information: [place] [phone]               Discharge Instructions     Diet - low sodium heart healthy   Complete by: As directed    Increase activity slowly   Complete by: As directed    No wound care   Complete by: As directed        Diet recommendation: Cardiac diet  Activity: The patient is advised to gradually reintroduce usual activities, as tolerated  Discharge Condition: stable  Code Status: Full code   History of present illness: As per the H and P dictated on admission, "Jaime [name] is a 87 y.o. male with medical history significant of BPH, CAD, colon diverticulosis and polyps, exertional dyspnea, nephrolithiasis, hyperlipidemia, osteoarthritis who is coming to the emergency department due to multiple episodes of hematochezia at home which have persisted while in the emergency department with several more times.  He has been lightheaded.  He denied abdominal pain or  melena.  No nausea or emesis.  No dysuria, frequency or hematuria.  No previous history of rectal bleeding.  No fever, chills, rhinorrhea, sore throat, wheezing, dyspnea or hemoptysis.  No chest pain, palpitations, diaphoresis, PND, orthopnea or pitting edema of the lower extremities.  Denied polyuria, polydipsia, polyphagia or blurred vision."  Hospital Course:  Summary of his active problems in the hospital is as following. diverticular bleed IR guided angiography negative for arterial bleed  no longer taking aspirin Colonoscopy negative, no bleed. PO iron at discharge with treatment for constipation   dilutional anemia Recheck hemoglobin in am--has component of dilutional anemia  Asymptomatic COVID-19-not requiring oxygen D-dimer 0.55 Continue remdesivir on 9/7  Asymptomatic CAD Continue statin in the outpatient setting  Patient was ambulatory without any assistance. On the day of the discharge the patient's vitals were stable, and no other new acute medical condition were reported. The patient was felt safe to be discharge at Home with no therapy needed on discharge.  Consultants: Gastroenterology  Procedures: EGD  DISCHARGE MEDICATION: Allergies as of 05/24/2021   No Known Allergies      Medication List     STOP taking these medications    amLODipine 5 MG tablet Commonly known as: NORVASC   amoxicillin 500 MG capsule Commonly known as: AMOXIL       TAKE these medications    acetaminophen 325 MG tablet Commonly known as: TYLENOL Take 650 mg by mouth every 6 (six) hours as needed  for headache or moderate pain. Notes to patient: Pain or headache   ascorbic acid 500 MG tablet Commonly known as: VITAMIN C Take 1 tablet (500 mg total) by mouth daily. Notes to patient: Supplement    atorvastatin 40 MG tablet Commonly known as: LIPITOR Take 40 mg by mouth daily. Notes to patient: Cholesterol    docusate sodium 100 MG capsule Commonly known as: Colace Take 1  capsule (100 mg total) by mouth daily. Notes to patient: Laxative   ferrous sulfate 325 (65 FE) MG tablet Take 1 tablet (325 mg total) by mouth daily. Notes to patient: Supplement    guaiFENesin 600 MG 12 hr tablet Commonly known as: Mucinex Take 1 tablet (600 mg total) by mouth 2 (two) times daily for 14 days. Notes to patient: Cough/Congestion    multivitamin Tabs tablet Take 1 tablet by mouth daily. Notes to patient: Supplement    tamsulosin 0.4 MG Caps capsule Commonly known as: FLOMAX Take 0.4 mg by mouth daily. Notes to patient: Prostate health    zinc sulfate 220 (50 Zn) MG capsule Take 1 capsule (220 mg total) by mouth daily. Notes to patient: Supplement         Discharge Exam: Filed Weights   05/20/21 2239 05/23/21 1404  Weight: 68.9 kg 68.9 kg   Vitals:   05/24/21 0850 05/24/21 1220  BP: 124/60 (!) 121/56  Pulse: 80 61  Resp: 17 17  Temp: 98.2 F (36.8 C) 98.1 F (36.7 C)  SpO2: 96% 99%   General: Appear in mild distress, no Rash; Oral Mucosa Clear, moist. no Abnormal Neck Mass Or lumps, Conjunctiva normal  Cardiovascular: S1 and S2 Present, no Murmur, Respiratory: good respiratory effort, Bilateral Air entry present and CTA, no Crackles, no wheezes Abdomen: Bowel Sound present, Soft and no tenderness Extremities: no Pedal edema Neurology: alert and oriented to time, place, and person affect appropriate. no new focal deficit Gait not checked due to patient safety concerns   The results of significant diagnostics from this hospitalization (including imaging, microbiology, ancillary and laboratory) are listed below for reference.    Significant Diagnostic Studies: IR Angiogram Visceral Selective  Result Date: 05/21/2021 INDICATION: Acute left lower quadrant proximal sigmoid diverticular GI bleed EXAM: Ultrasound guidance for vascular access Selective IMA catheterization and angiogram MEDICATIONS: 1% lidocaine local. ANESTHESIA/SEDATION: Moderate  (conscious) sedation was employed during this procedure. A total of Versed 1.0 mg and Fentanyl 25 mcg was administered intravenously. Moderate Sedation Time: 31 minutes. The patient's level of consciousness and vital signs were monitored continuously by radiology nursing throughout the procedure under my direct supervision. CONTRAST:  75 cc omni 300 FLUOROSCOPY TIME:  Fluoroscopy Time: 2 minutes 48 seconds (195 mGy). COMPLICATIONS: None immediate. PROCEDURE: Informed consent was obtained from the patient following explanation of the procedure, risks, benefits and alternatives. The patient understands, agrees and consents for the procedure. All questions were addressed. A time out was performed prior to the initiation of the procedure. Maximal barrier sterile technique utilized including caps, mask, sterile gowns, sterile gloves, large sterile drape, hand hygiene, and Betadine prep. Under sterile conditions and local anesthesia, ultrasound micropuncture access performed the right common femoral artery. Five French sheath inserted over a Bentson guidewire. A Sos Omni select catheter was advanced over a Bentson guidewire and reformed in the aorta. Selective catheterization of the IMA performed. IMA angiogram performed. IMA: IMA and its branches are widely patent. No active arterial bleeding in the left lower quadrant proximal sigmoid colon to correlate with the  CTA finding. IMA angiography was repeated over two 5 minute intervals without any evidence of intermittent active arterial bleeding. On the venous phase, there is some tortuosity of the upper rectal draining veins into the patent IMV noted. Access removed. Hemostasis obtained with manual compression. No immediate complication. Patient tolerated the procedure well. IMPRESSION: Negative IMA angiogram for active arterial bleeding. Electronically Signed   By: Jerilynn Mages.  Shick M.D.   On: 05/21/2021 13:47   IR US Guide Vasc Access Right  Result Date:  05/21/2021 INDICATION: Acute left lower quadrant proximal sigmoid diverticular GI bleed EXAM: Ultrasound guidance for vascular access Selective IMA catheterization and angiogram MEDICATIONS: 1% lidocaine local. ANESTHESIA/SEDATION: Moderate (conscious) sedation was employed during this procedure. A total of Versed 1.0 mg and Fentanyl 25 mcg was administered intravenously. Moderate Sedation Time: 31 minutes. The patient's level of consciousness and vital signs were monitored continuously by radiology nursing throughout the procedure under my direct supervision. CONTRAST:  75 cc omni 300 FLUOROSCOPY TIME:  Fluoroscopy Time: 2 minutes 48 seconds (195 mGy). COMPLICATIONS: None immediate. PROCEDURE: Informed consent was obtained from the patient following explanation of the procedure, risks, benefits and alternatives. The patient understands, agrees and consents for the procedure. All questions were addressed. A time out was performed prior to the initiation of the procedure. Maximal barrier sterile technique utilized including caps, mask, sterile gowns, sterile gloves, large sterile drape, hand hygiene, and Betadine prep. Under sterile conditions and local anesthesia, ultrasound micropuncture access performed the right common femoral artery. Five French sheath inserted over a Bentson guidewire. A Sos Omni select catheter was advanced over a Bentson guidewire and reformed in the aorta. Selective catheterization of the IMA performed. IMA angiogram performed. IMA: IMA and its branches are widely patent. No active arterial bleeding in the left lower quadrant proximal sigmoid colon to correlate with the CTA finding. IMA angiography was repeated over two 5 minute intervals without any evidence of intermittent active arterial bleeding. On the venous phase, there is some tortuosity of the upper rectal draining veins into the patent IMV noted. Access removed. Hemostasis obtained with manual compression. No immediate complication.  Patient tolerated the procedure well. IMPRESSION: Negative IMA angiogram for active arterial bleeding. Electronically Signed   By: Jerilynn Mages.  Shick M.D.   On: 05/21/2021 13:47   DG Chest Port 1 View  Result Date: 05/20/2021 CLINICAL DATA:  Shortness of breath EXAM: PORTABLE CHEST 1 VIEW COMPARISON:  09/14/2020 FINDINGS: Stable chronic elevation of the right hemidiaphragm. Chronic changes in the lungs with scarring. Heart is normal size. No acute opacities or effusions. IMPRESSION: Stable chronic elevation of the right hemidiaphragm. Stable chronic interstitial prominence, likely chronic lung disease/scarring. Electronically Signed   By: Rolm Baptise M.D.   On: 05/20/2021 23:40   CT Angio Abd/Pel W and/or Wo Contrast  Result Date: 05/21/2021 CLINICAL DATA:  Rectal bleeding EXAM: CTA ABDOMEN AND PELVIS WITHOUT AND WITH CONTRAST TECHNIQUE: Multidetector CT imaging of the abdomen and pelvis was performed using the standard protocol during bolus administration of intravenous contrast. Multiplanar reconstructed images and MIPs were obtained and reviewed to evaluate the vascular anatomy. CONTRAST:  137m OMNIPAQUE IOHEXOL 350 MG/ML SOLN COMPARISON:  09/21/2017 FINDINGS: VASCULAR Aorta: Heavily calcified aorta.  No aneurysm.  No dissection. Celiac: Patent without evidence of aneurysm, dissection, vasculitis or significant stenosis. SMA: Patent without evidence of aneurysm, dissection, vasculitis or significant stenosis. Renals: Both renal arteries are patent without evidence of aneurysm, dissection, vasculitis, fibromuscular dysplasia or significant stenosis. IMA: Patent without evidence of aneurysm,  dissection, vasculitis or significant stenosis. Inflow: Patent without evidence of aneurysm, dissection, vasculitis or significant stenosis. Proximal Outflow: Bilateral common femoral and visualized portions of the superficial and profunda femoral arteries are patent without evidence of aneurysm, dissection, vasculitis or  significant stenosis. Veins: No obvious venous abnormality within the limitations of this arterial phase study. Review of the MIP images confirms the above findings. NON-VASCULAR Lower chest: No acute abnormality Hepatobiliary: No focal hepatic abnormality. Gallbladder unremarkable. Pancreas: No focal abnormality or ductal dilatation. Spleen: No focal abnormality.  Normal size. Adrenals/Urinary Tract: Small cysts in the kidneys bilaterally. No stones or hydronephrosis. Adrenal glands and urinary bladder unremarkable. Stomach/Bowel: Diffuse colonic diverticulosis. There is contrast seen within the proximal sigmoid colon, likely active bleeding from a diverticulum. No bowel obstruction. Stomach and small bowel decompressed, grossly unremarkable. Lymphatic: No adenopathy Reproductive: Prostate enlargement Other: No free fluid or free air. Musculoskeletal: No acute bony abnormality. IMPRESSION: VASCULAR Contrast extravasation noted in the proximal sigmoid colon, likely source of GI bleed from a diverticulum. Aortic atherosclerosis. NON-VASCULAR Diffuse colonic diverticulosis.  No active diverticulitis. Prostate enlargement. Electronically Signed   By: Rolm Baptise M.D.   On: 05/21/2021 01:55    Microbiology: Recent Results (from the past 240 hour(s))  Resp Panel by RT-PCR (Flu A&B, Covid) Nasopharyngeal Swab     Status: Abnormal   Collection Time: 05/21/21  2:02 AM   Specimen: Nasopharyngeal Swab; Nasopharyngeal(NP) swabs in vial transport medium  Result Value Ref Range Status   SARS Coronavirus 2 by RT PCR POSITIVE (A) NEGATIVE Final    Comment: RESULT CALLED TO, READ BACK BY AND VERIFIED WITH: OSORIO,B @ 0413 ON 05/21/21 BY JUW (NOTE) SARS-CoV-2 target nucleic acids are DETECTED.  The SARS-CoV-2 RNA is generally detectable in upper respiratory specimens during the acute phase of infection. Positive results are indicative of the presence of the identified virus, but do not rule out bacterial infection or  co-infection with other pathogens not detected by the test. Clinical correlation with patient history and other diagnostic information is necessary to determine patient infection status. The expected result is Negative.  Fact Sheet for Patients: EntrepreneurPulse.com.au  Fact Sheet for Healthcare Providers: IncredibleEmployment.be  This test is not yet approved or cleared by the [place] FDA and  has been authorized for detection and/or diagnosis of SARS-CoV-2 by FDA under an Emergency Use Authorization (EUA).  This EUA will remain in effect (meaning this test can be  used) for the duration of  the COVID-19 declaration under Section 564(b)(1) of the Act, 21 U.S.C. section 360bbb-3(b)(1), unless the authorization is terminated or revoked sooner.     Influenza A by PCR NEGATIVE NEGATIVE Final   Influenza B by PCR NEGATIVE NEGATIVE Final    Comment: (NOTE) The Xpert Xpress SARS-CoV-2/FLU/RSV plus assay is intended as an aid in the diagnosis of influenza from Nasopharyngeal swab specimens and should not be used as a sole basis for treatment. Nasal washings and aspirates are unacceptable for Xpert Xpress SARS-CoV-2/FLU/RSV testing.  Fact Sheet for Patients: EntrepreneurPulse.com.au  Fact Sheet for Healthcare Providers: IncredibleEmployment.be  This test is not yet approved or cleared by the [place] FDA and has been authorized for detection and/or diagnosis of SARS-CoV-2 by FDA under an Emergency Use Authorization (EUA). This EUA will remain in effect (meaning this test can be used) for the duration of the COVID-19 declaration under Section 564(b)(1) of the Act, 21 U.S.C. section 360bbb-3(b)(1), unless the authorization is terminated or revoked.  Performed at [facility], [phone]  [address] [place], [place] [address]      Labs: CBC: Recent Labs  Lab 05/23/21 0039 05/24/21 0141  WBC 11.0* 14.7*   NEUTROABS 7.1 9.9*  HGB 7.5* 8.4*  HCT 23.0* 25.0*  MCV 92.0 91.9  PLT 257 0000000   Basic Metabolic Panel: Recent Labs  Lab 05/23/21 0039 05/24/21 0141  NA 141 141  K 3.4* 3.4*  CL 112* 112*  CO2 24 25  GLUCOSE 89 102*  BUN 17 13  CREATININE 1.00 1.22  CALCIUM 7.6* 7.6*   Liver Function Tests: Recent Labs  Lab 05/23/21 0039 05/24/21 0141  AST 23 28  ALT 16 19  ALKPHOS 45 47  BILITOT 1.0 1.0  PROT 5.0* 4.7*  ALBUMIN 2.5* 2.5*   CBG: No results for input(s): GLUCAP in the last 168 hours.  Time spent: 35 minutes  Signed:  Berle Mull  [facility] 05/24/2021

## 2021-06-01 DIAGNOSIS — Z6824 Body mass index (BMI) 24.0-24.9, adult: Secondary | ICD-10-CM | POA: Diagnosis not present

## 2021-06-01 DIAGNOSIS — D649 Anemia, unspecified: Secondary | ICD-10-CM | POA: Diagnosis not present

## 2021-06-01 DIAGNOSIS — E876 Hypokalemia: Secondary | ICD-10-CM | POA: Diagnosis not present

## 2021-07-18 DIAGNOSIS — Z23 Encounter for immunization: Secondary | ICD-10-CM | POA: Diagnosis not present

## 2021-09-29 ENCOUNTER — Encounter: Payer: Self-pay | Admitting: Physician Assistant

## 2021-10-16 ENCOUNTER — Ambulatory Visit: Payer: Medicare HMO | Admitting: Physician Assistant

## 2021-10-16 ENCOUNTER — Encounter: Payer: Self-pay | Admitting: Physician Assistant

## 2021-10-16 ENCOUNTER — Other Ambulatory Visit (INDEPENDENT_AMBULATORY_CARE_PROVIDER_SITE_OTHER): Payer: Medicare HMO

## 2021-10-16 VITALS — BP 130/66 | HR 80 | Ht 68.0 in | Wt 149.4 lb

## 2021-10-16 DIAGNOSIS — R195 Other fecal abnormalities: Secondary | ICD-10-CM | POA: Diagnosis not present

## 2021-10-16 DIAGNOSIS — R634 Abnormal weight loss: Secondary | ICD-10-CM | POA: Diagnosis not present

## 2021-10-16 DIAGNOSIS — R630 Anorexia: Secondary | ICD-10-CM | POA: Diagnosis not present

## 2021-10-16 DIAGNOSIS — D62 Acute posthemorrhagic anemia: Secondary | ICD-10-CM

## 2021-10-16 LAB — COMPREHENSIVE METABOLIC PANEL
ALT: 12 U/L (ref 0–53)
AST: 16 U/L (ref 0–37)
Albumin: 4.1 g/dL (ref 3.5–5.2)
Alkaline Phosphatase: 74 U/L (ref 39–117)
BUN: 18 mg/dL (ref 6–23)
CO2: 27 mEq/L (ref 19–32)
Calcium: 9 mg/dL (ref 8.4–10.5)
Chloride: 106 mEq/L (ref 96–112)
Creatinine, Ser: 1.02 mg/dL (ref 0.40–1.50)
GFR: 66.62 mL/min (ref >60.00)
Glucose, Bld: 82 mg/dL (ref 70–99)
Potassium: 3.8 mEq/L (ref 3.5–5.1)
Sodium: 141 mEq/L (ref 135–145)
Total Bilirubin: 1.5 mg/dL — ABNORMAL HIGH (ref 0.2–1.2)
Total Protein: 7.6 g/dL (ref 6.0–8.3)

## 2021-10-16 LAB — CBC WITH DIFFERENTIAL/PLATELET
Basophils Absolute: 0.1 10*3/uL (ref 0.0–0.1)
Basophils Relative: 0.9 % (ref 0.0–3.0)
Eosinophils Absolute: 1.3 10*3/uL — ABNORMAL HIGH (ref 0.0–0.7)
Eosinophils Relative: 12.1 % — ABNORMAL HIGH (ref 0.0–5.0)
HCT: 40.5 % (ref 39.0–52.0)
Hemoglobin: 13.3 g/dL (ref 13.0–17.0)
Lymphocytes Relative: 18.8 % (ref 12.0–46.0)
Lymphs Abs: 1.9 10*3/uL (ref 0.7–4.0)
MCHC: 32.8 g/dL (ref 30.0–36.0)
MCV: 89.8 fl (ref 78.0–100.0)
Monocytes Absolute: 0.7 10*3/uL (ref 0.1–1.0)
Monocytes Relative: 6.7 % (ref 3.0–12.0)
Neutro Abs: 6.4 10*3/uL (ref 1.4–7.7)
Neutrophils Relative %: 61.5 % (ref 43.0–77.0)
Platelets: 304 10*3/uL (ref 150.0–400.0)
RBC: 4.51 Mil/uL (ref 4.22–5.81)
RDW: 14.5 % (ref 11.5–15.5)
WBC: 10.4 10*3/uL (ref 4.0–10.5)

## 2021-10-16 LAB — TSH: TSH: 1.06 u[IU]/mL (ref 0.35–5.50)

## 2021-10-16 LAB — IRON: Iron: 89 ug/dL (ref 42–165)

## 2021-10-16 NOTE — Patient Instructions (Addendum)
If you are age 86 or older, your body mass index should be between 23-30. Your Body mass index is 22.72 kg/m. If this is out of the aforementioned range listed, please consider follow up with your Primary Care Provider. ________________________________________________________  The [facility] providers would like to encourage you to use [facility] to communicate with providers for non-urgent requests or questions.  Due to long hold times on the telephone, sending your provider a message by [facility] may be a faster and more efficient way to get a response.  Please allow 48 business hours for a response.  Please remember that this is for non-urgent requests.  _______________________________________________________  Your provider has requested that you go to the basement level for lab work before leaving today. Press "B" on the elevator. The lab is located at the first door on the left as you exit the elevator.   Be sure to eat at least 1 snack daily.  Drink 1-2 boosts/ensure daily  Call the office if you continue to loose weight.  Thank you for entrusting me with your care and choosing [facility].  Amy Esterwood, PA-C

## 2021-10-17 LAB — IRON AND TIBC
Iron Saturation: 31 % (ref 15–55)
Iron: 84 ug/dL (ref 38–169)
Total Iron Binding Capacity: 273 ug/dL (ref 250–450)
UIBC: 189 ug/dL (ref 111–343)

## 2021-10-18 ENCOUNTER — Encounter: Payer: Self-pay | Admitting: Physician Assistant

## 2021-10-18 NOTE — Progress Notes (Signed)
Subjective:    Patient ID: Jaime [name], male    DOB: [birth year], 88 y.o.   MRN: [id number]  HPI Jaime [name] is a pleasant 88 year old white male, established with Dr. Lyndel [name] when he was seen during hospitalization in September 2022 with a GI bleed.  He is coming back in today for evaluation of intermittent dark stools and weight loss. Patient was felt to have a diverticular bleed when admitted in September 2022.  He did have a CT angio done which was positive for bleeding from the sigmoid colon.  Study was otherwise unremarkable with the exception of aortic atherosclerosis.    He then had colonoscopy on 05/23/2021 with finding of 2 small sessile polyps each about 6 mm in the distal sigmoid and mid descending colon.  Path and these were both consistent with tubular adenomas and also noted to have pancolonic diverticulosis and internal hemorrhoids. Hemoglobin was 8.4 at the time of discharge from the hospital.  He also has history of coronary artery disease, hypertension, decreased auditory acuity, and BPH. He was placed on an iron supplement at the time of the hospitalization which she has continued. He has apparently continued to note some dark stools off and on ever since the hospitalization but has not seen any overt hematochezia.  His bowel movements have been fairly regular, he says he may miss a day here or there.  He has no complaints of abdominal pain or discomfort. Regarding his weight loss he has lost a total of 20 pounds over the past 2 years, and feels that perhaps he is lost 5 pounds since hospitalization in September.  Appetite has been okay, no complaints of nausea or vomiting though he does get some occasional queasiness, no heartburn or indigestion and no complaints of dysphagia.  Energy level has been okay for him though he does fatigue more easily than he used to.  He is the primary caregiver for his wife. He was sick with an acute respiratory illness a few weeks ago and says that he has  not had as much energy since then and also ate poorly during that time.  His daughter says his appetite has definitely picked up over the past week or so. Patient's daughter relates that patient and her mom generally just eat 2 meals per day.  Patient also was diagnosed with COVID-19 during his hospitalization in September, so he has had multiple factors playing into potential weight loss over the past 4 to 5 months.  Review of Systems. Pertinent positive and negative review of systems were noted in the above HPI section.  All other review of systems was otherwise negative.   Outpatient Encounter Medications as of 10/16/2021  Medication Sig   acetaminophen (TYLENOL) 325 MG tablet Take 650 mg by mouth every 6 (six) hours as needed for headache or moderate pain.   ascorbic acid (VITAMIN C) 500 MG tablet Take 1 tablet (500 mg total) by mouth daily.   atorvastatin (LIPITOR) 40 MG tablet Take 40 mg by mouth daily.   docusate sodium (COLACE) 100 MG capsule Take 1 capsule (100 mg total) by mouth daily.   ferrous sulfate 325 (65 FE) MG tablet Take 1 tablet (325 mg total) by mouth daily.   multivitamin (ONE-A-DAY MEN'S) TABS tablet Take 1 tablet by mouth daily.   tamsulosin (FLOMAX) 0.4 MG CAPS capsule Take 0.4 mg by mouth daily.   zinc sulfate 220 (50 Zn) MG capsule Take 1 capsule (220 mg total) by mouth daily.   No facility-administered  encounter medications on file as of 10/16/2021.   No Known Allergies Patient Active Problem List   Diagnosis Date Noted   GI bleed 05/22/2021   Bright red rectal bleeding 05/21/2021   Hypocalcemia 05/21/2021   Acute blood loss anemia 05/21/2021   Hypertension 05/21/2021   COVID-19 virus infection 05/21/2021   Hyperthyroidism 07/18/2019   Difficulty in walking(719.7) 03/26/2012   Elevated bilirubin 03/25/2012   Shortness of breath 05/16/2011   Undiagnosed cardiac murmurs 05/16/2011   Dizziness and giddiness 05/16/2011   Mixed hyperlipidemia 05/16/2011    Coronary atherosclerosis of native coronary artery 05/16/2011   Social History   Socioeconomic History   Marital status: Married    Spouse name: Jaime [name]   Number of children: 3   Years of education: Not on file   Highest education level: Not on file  Occupational History   Occupation: Retired    Fish farm manager: RETIRED    Comment: Truck driver  Tobacco Use   Smoking status: Never   Smokeless tobacco: Current    Types: Chew   Tobacco comments:    chew tobacco for 30 years (as of 09-30-2017)  Vaping Use   Vaping Use: Never used  Substance and Sexual Activity   Alcohol use: No   Drug use: No   Sexual activity: Not on file  Other Topics Concern   Not on file  Social History Narrative   Not on file   Social Determinants of Health   Financial Resource Strain: Not on file  Food Insecurity: Not on file  Transportation Needs: Not on file  Physical Activity: Not on file  Stress: Not on file  Social Connections: Not on file  Intimate Partner Violence: Not on file    Jaime [name]'s family history includes Cancer in his father.      Objective:    Vitals:   10/16/21 1428  BP: 130/66  Pulse: 80    Physical Exam Well-developed well-nourished elderly white male in no acute distress.  Height, Weight, 149 BMI 22.7 ,accompanied by his daughter  HEENT; nontraumatic normocephalic, EOMI, PE R LA, sclera anicteric. Oropharynx; not examined today Neck; supple, no JVD Cardiovascular; regular rate and rhythm with S1-S2, prominent systolic murmur Pulmonary; Clear bilaterally Abdomen; soft, nontender, nondistended, no palpable mass or hepatosplenomegaly, bowel sounds are active Rectal; brown stool, heme-negative Skin; benign exam, no jaundice rash or appreciable lesions Extremities; no clubbing cyanosis or edema skin warm and dry Neuro/Psych; alert and oriented x4, grossly nonfocal mood and affect appropriate , hard of hearing       Assessment & Plan:   #72 88 year old white  male with hospitalization September 2022 with rectal bleeding, felt to have had a diverticular hemorrhage, with positive CTA and then colonoscopy revealing pandiverticulosis. #2 anemia secondary to above #3 intermittent dark stools over the past 4 months by patient report-stool was brown and heme-negative today.  Query whether the oral iron supplement is responsible for the intermittent dark stools.  No current evidence for ongoing GI blood loss  #4 weight loss-multifactoral, with 20 pound weight loss over the past 2 years. I believe some of this has been stress related with caring for his wife, COVID-19 in September 2022, dietary, and also with recent respiratory illness. No evidence for malignancy on CT angiography of the abdomen in September 2022  #5 coronary artery disease #6.  Hypertension #7.  Decreased auditory acuity  Plan; CBC with differential today, c-Met, iron studies, TSH Patient and daughter reassured that there does not appear to  be any active GI bleeding at this time, and that stool is heme-negative. If hemoglobin and iron studies both normal will discontinue oral iron supplement which had been started empirically while he was hospitalized in September We discussed adding Ensure or boost at least 1 carton daily for increased calories, and also suggested he eat at least 1 additional snack each day i.e. peanut butter crackers or similar to increase caloric intake.  We discussed that the goal would not be for him to gain weight but to prevent any further weight loss.\ I have asked him to weigh himself weekly, and if he has persistent weight loss despite dietary suggestions as above, happy to see him back in 1 month.  #5  Nirvaan Frett S Bellina Tokarczyk PA-C 10/18/2021   Cc: [employer], MD

## 2021-10-25 DIAGNOSIS — J09X2 Influenza due to identified novel influenza A virus with other respiratory manifestations: Secondary | ICD-10-CM | POA: Diagnosis not present

## 2022-03-19 DIAGNOSIS — H35371 Puckering of macula, right eye: Secondary | ICD-10-CM | POA: Diagnosis not present

## 2022-03-29 DIAGNOSIS — N401 Enlarged prostate with lower urinary tract symptoms: Secondary | ICD-10-CM | POA: Diagnosis not present

## 2022-03-29 DIAGNOSIS — Z6821 Body mass index (BMI) 21.0-21.9, adult: Secondary | ICD-10-CM | POA: Diagnosis not present

## 2022-03-29 DIAGNOSIS — Z125 Encounter for screening for malignant neoplasm of prostate: Secondary | ICD-10-CM | POA: Diagnosis not present

## 2022-03-29 DIAGNOSIS — Z0001 Encounter for general adult medical examination with abnormal findings: Secondary | ICD-10-CM | POA: Diagnosis not present

## 2022-03-29 DIAGNOSIS — D518 Other vitamin B12 deficiency anemias: Secondary | ICD-10-CM | POA: Diagnosis not present

## 2022-03-29 DIAGNOSIS — E039 Hypothyroidism, unspecified: Secondary | ICD-10-CM | POA: Diagnosis not present

## 2022-03-29 DIAGNOSIS — Z1331 Encounter for screening for depression: Secondary | ICD-10-CM | POA: Diagnosis not present

## 2022-03-29 DIAGNOSIS — E559 Vitamin D deficiency, unspecified: Secondary | ICD-10-CM | POA: Diagnosis not present

## 2022-03-29 DIAGNOSIS — I10 Essential (primary) hypertension: Secondary | ICD-10-CM | POA: Diagnosis not present

## 2022-05-28 DIAGNOSIS — R351 Nocturia: Secondary | ICD-10-CM | POA: Diagnosis not present

## 2022-05-28 DIAGNOSIS — N401 Enlarged prostate with lower urinary tract symptoms: Secondary | ICD-10-CM | POA: Diagnosis not present

## 2022-05-28 DIAGNOSIS — N200 Calculus of kidney: Secondary | ICD-10-CM | POA: Diagnosis not present

## 2022-06-19 DIAGNOSIS — Z23 Encounter for immunization: Secondary | ICD-10-CM | POA: Diagnosis not present

## 2022-06-30 IMAGING — DX DG CHEST 1V PORT
1 series · 1 of 1 positions shown · non-contrast
Comparison: Chest x-ray dated August 18, 2008.

CLINICAL DATA: Cough.  Hypoxia.

EXAM:
PORTABLE CHEST 1 VIEW

[chest ap]
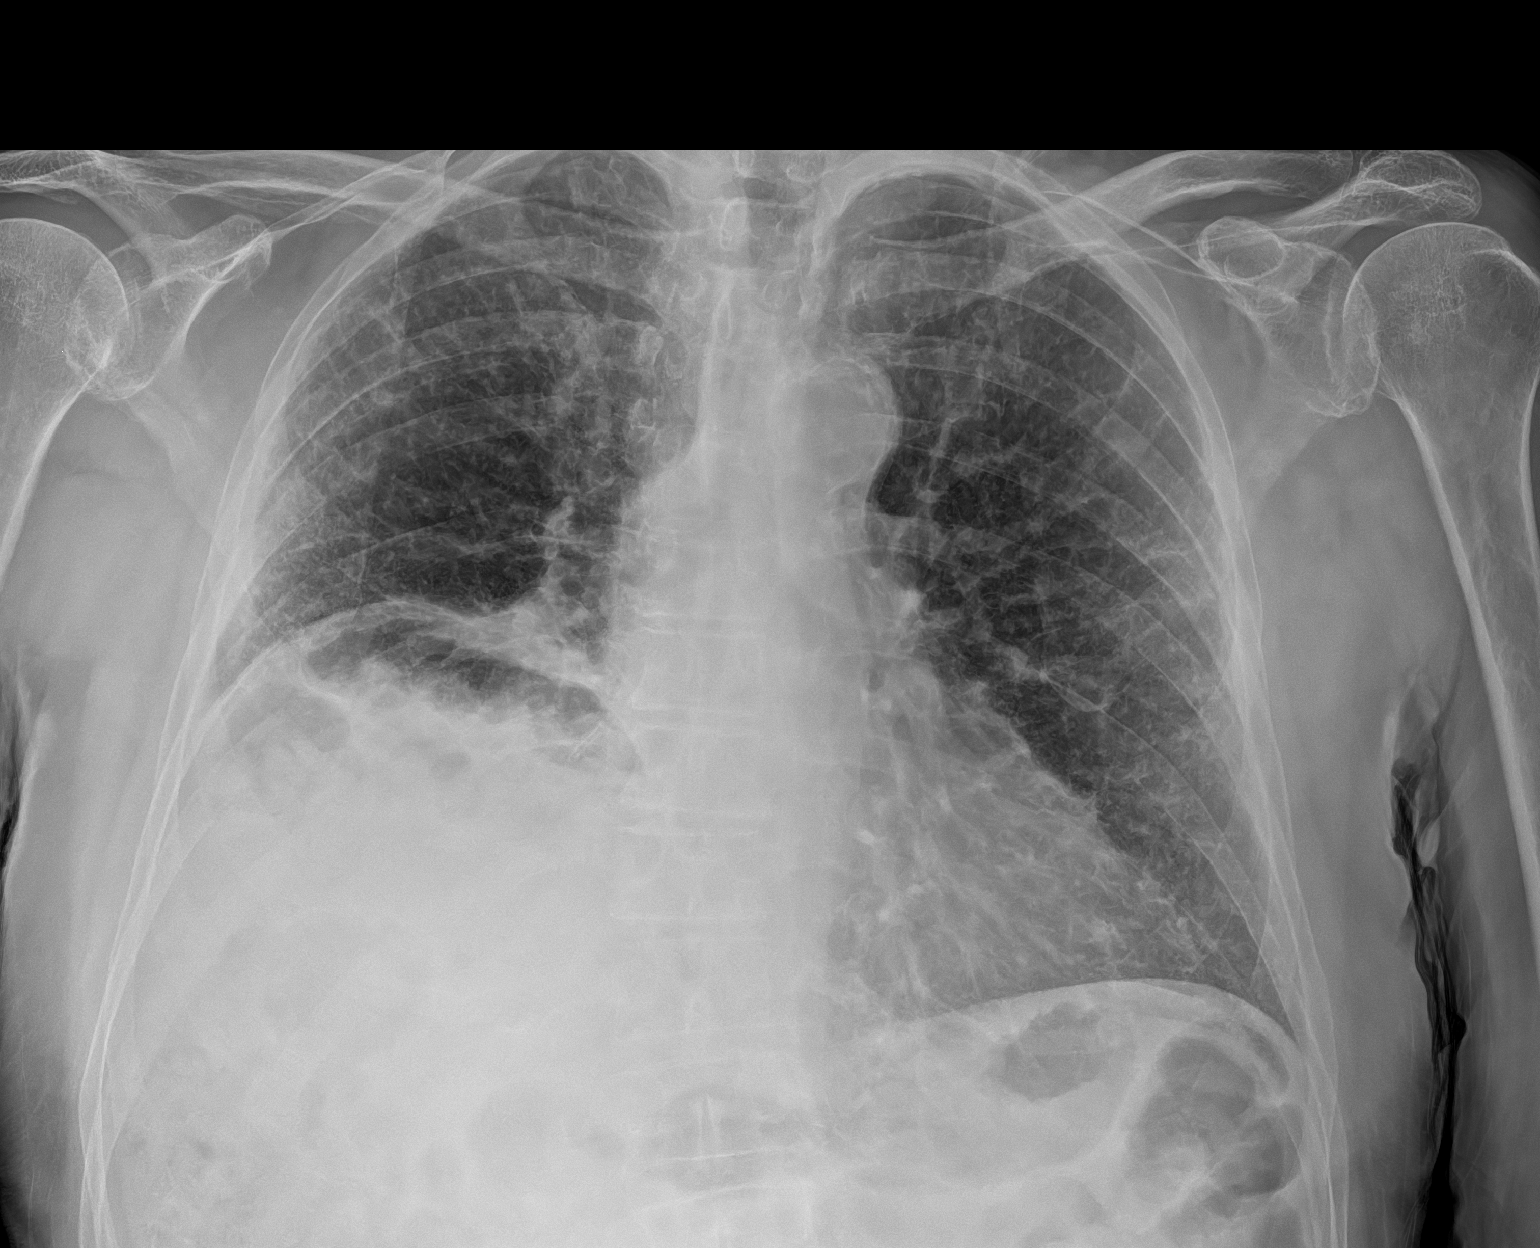

[1 of 1 positions shown; findings below may reference images not displayed]

FINDINGS: The heart size and mediastinal contours are within normal limits.
Diffuse interstitial thickening. No focal consolidation, pleural
effusion, or pneumothorax. Chronic markedly elevated right
hemidiaphragm. No acute osseous abnormality.
IMPRESSION: 1. Diffuse interstitial thickening may reflect pulmonary edema or
atypical infection.

## 2023-03-06 IMAGING — CT CT CTA ABD/PEL W/CM AND/OR W/O CM
3 of 9 series · 11 of 46 positions shown, 17 images · IV contrast (omnipaque)
Comparison: 09/21/2017

CLINICAL DATA: Rectal bleeding

EXAM:
CTA ABDOMEN AND PELVIS WITHOUT AND WITH CONTRAST
TECHNIQUE: Multidetector CT imaging of the abdomen and pelvis was performed
using the standard protocol during bolus administration of
intravenous contrast. Multiplanar reconstructed images and MIPs were
obtained and reviewed to evaluate the vascular anatomy.
CONTRAST:  100mL OMNIPAQUE IOHEXOL 350 MG/ML SOLN

[Series 5: mesenteric axial arterial · axial · arterial · 0.91mm/px · z∈[-414,-224]mm · 4 of 270 slices shown]
[im 32/270  soft-tissue]
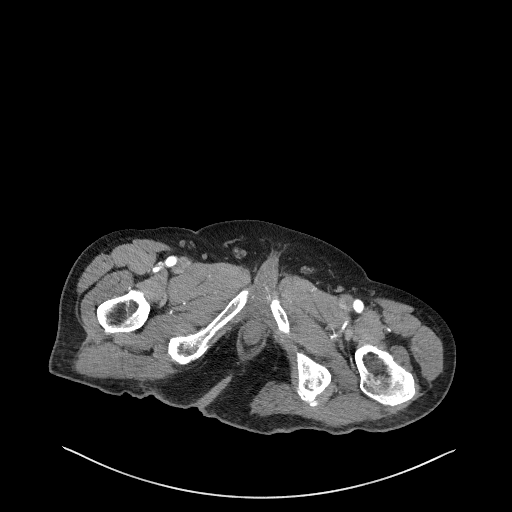
[im 64/270  soft-tissue]
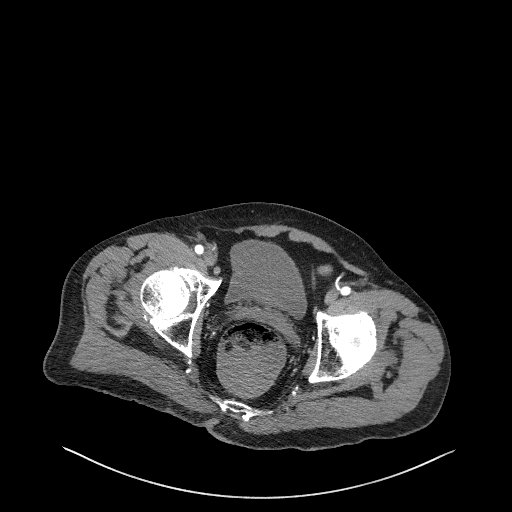
[im 95/270  soft-tissue]
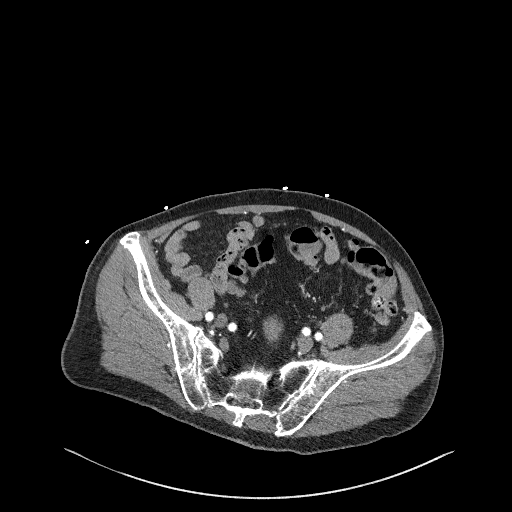
[im 127/270  soft-tissue]
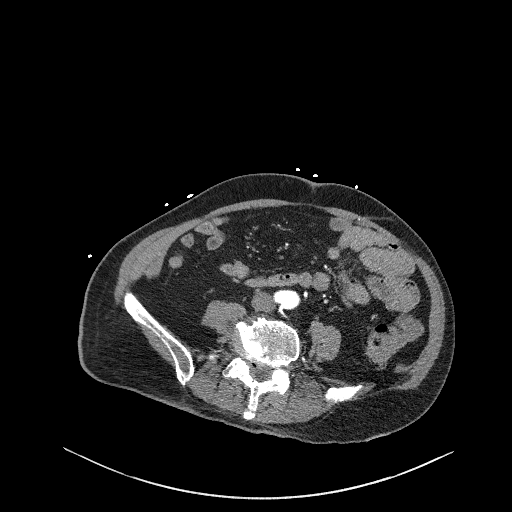

[Series 7: coronals · coronal · 0.76mm/px · 2 of 120 slices shown, 3 images]
[im 40/120  soft-tissue]
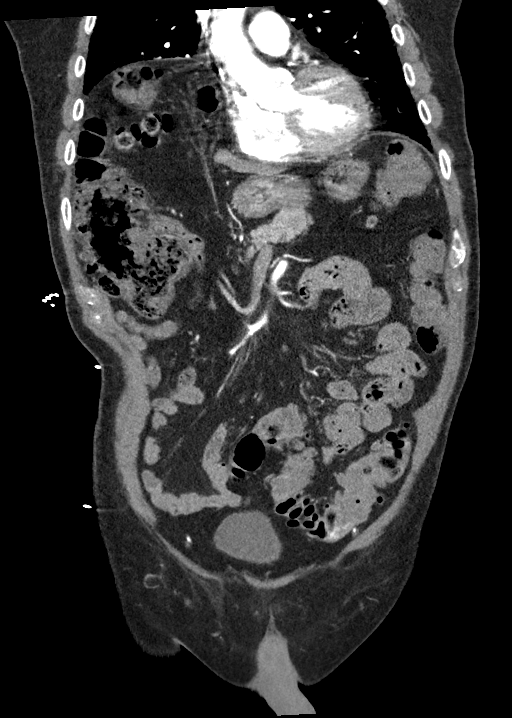
[im 40/120  bone]
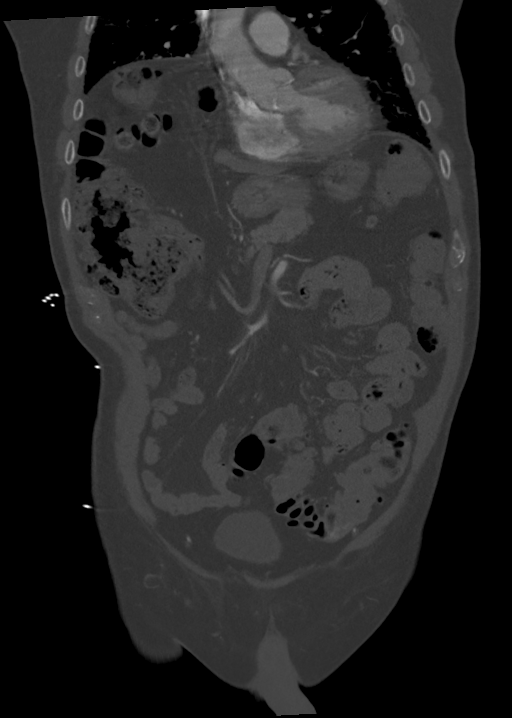
[im 80/120  soft-tissue]
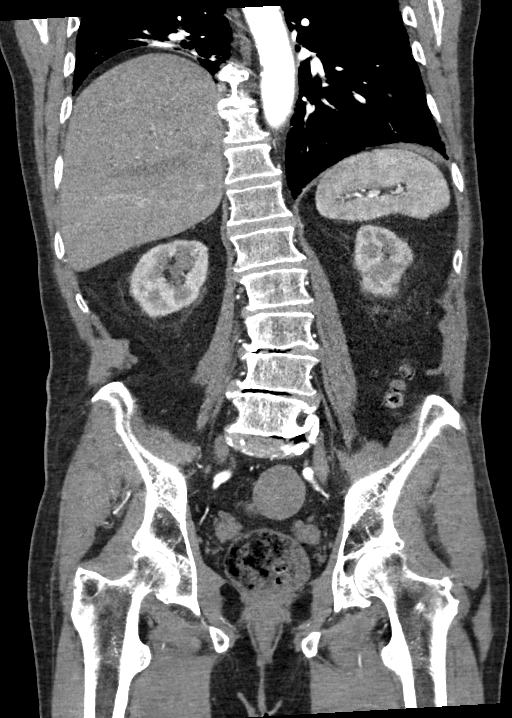

[Series 14: axial venous · axial · portal-venous · 0.84mm/px · z∈[-395,-35]mm · 5 of 108 slices shown, 10 images]
[im 18/108  soft-tissue]
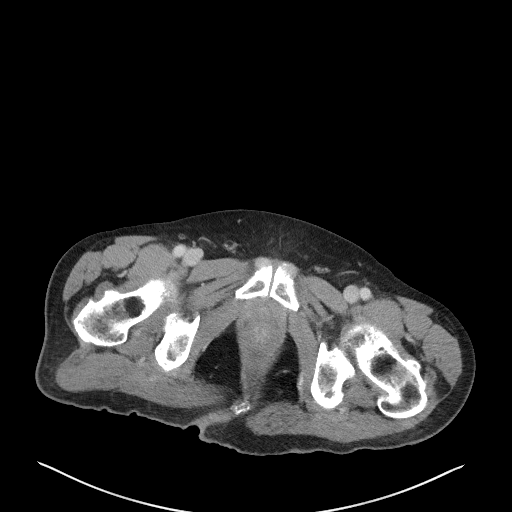
[im 18/108  bone]
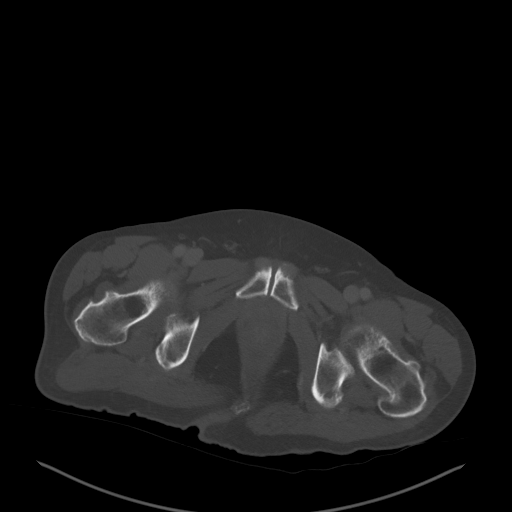
[im 36/108  soft-tissue]
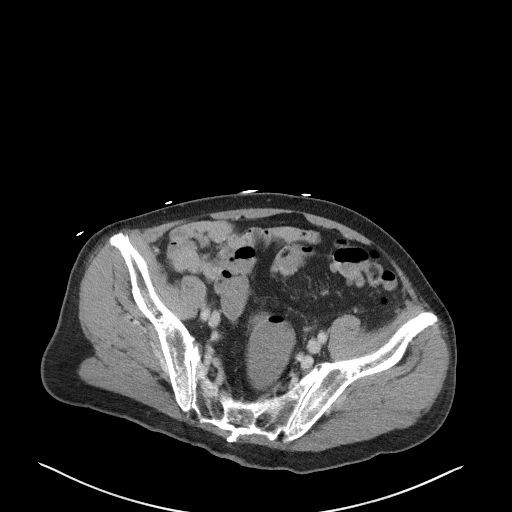
[im 36/108  lung]
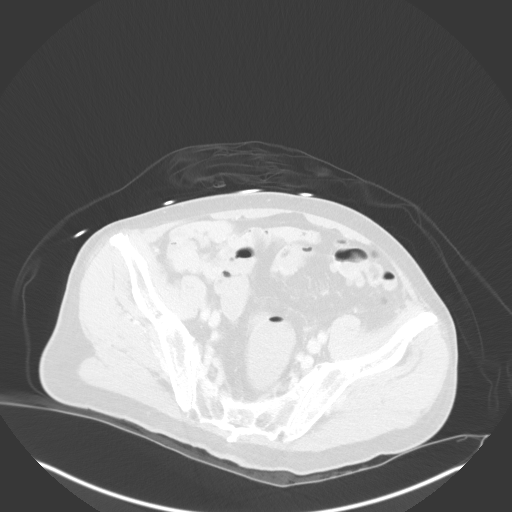
[im 54/108  soft-tissue]
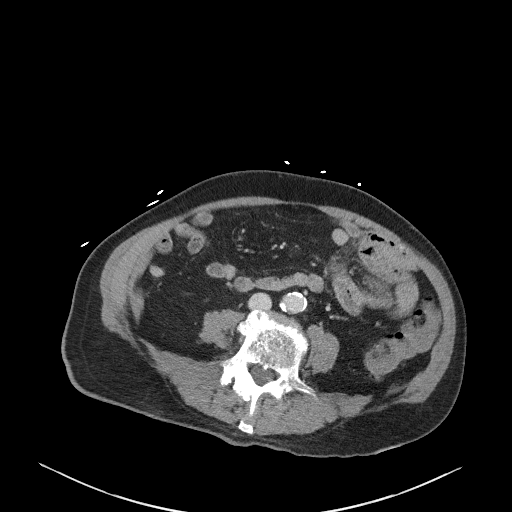
[im 54/108  lung]
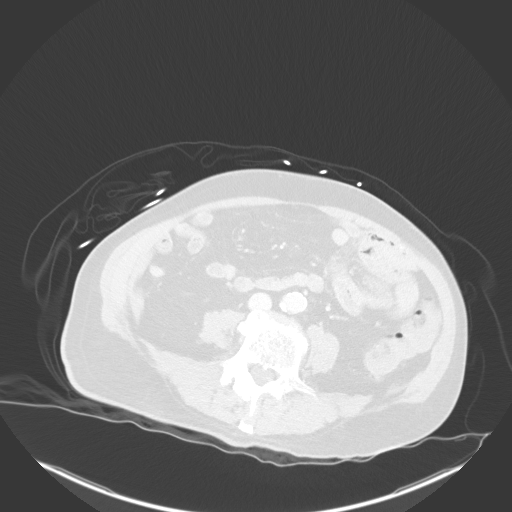
[im 72/108  soft-tissue]
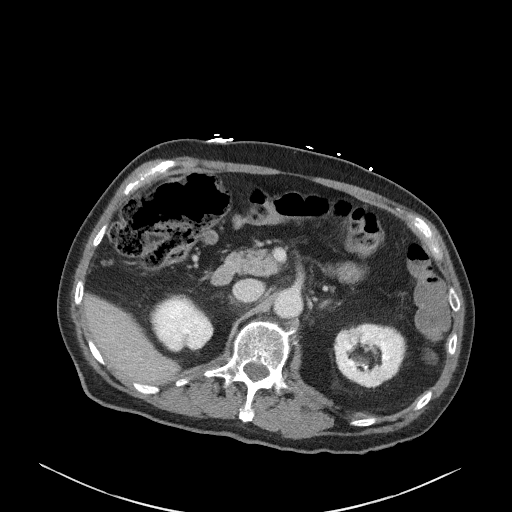
[im 72/108  lung]
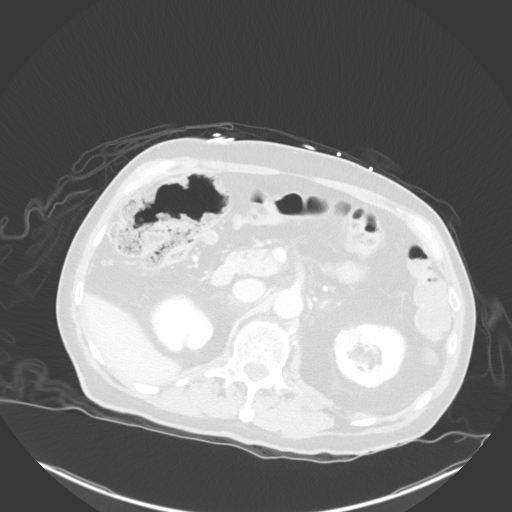
[im 90/108  soft-tissue]
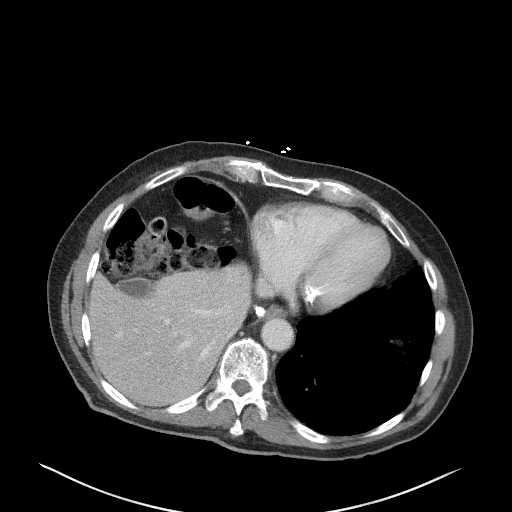
[im 90/108  lung]
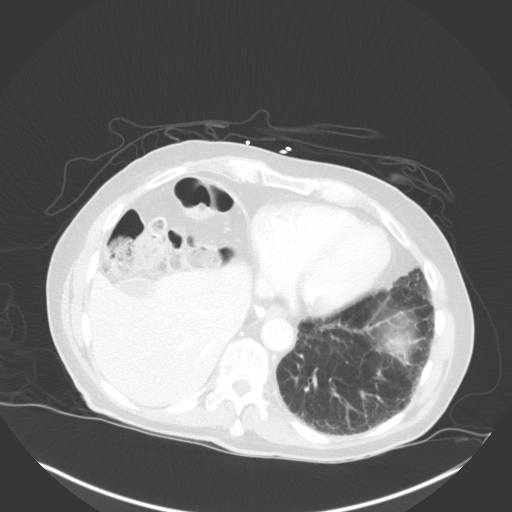

[11 of 46 positions shown; findings below may reference images not displayed]

FINDINGS: VASCULAR

Aorta: Heavily calcified aorta.  No aneurysm.  No dissection.

Celiac: Patent without evidence of aneurysm, dissection, vasculitis
or significant stenosis.

SMA: Patent without evidence of aneurysm, dissection, vasculitis or
significant stenosis.

Renals: Both renal arteries are patent without evidence of aneurysm,
dissection, vasculitis, fibromuscular dysplasia or significant
stenosis.

IMA: Patent without evidence of aneurysm, dissection, vasculitis or
significant stenosis.

Inflow: Patent without evidence of aneurysm, dissection, vasculitis
or significant stenosis.

Proximal Outflow: Bilateral common femoral and visualized portions
of the superficial and profunda femoral arteries are patent without
evidence of aneurysm, dissection, vasculitis or significant
stenosis.

Veins: No obvious venous abnormality within the limitations of this
arterial phase study.

Review of the MIP images confirms the above findings.

NON-VASCULAR

Lower chest: No acute abnormality

Hepatobiliary: No focal hepatic abnormality. Gallbladder
unremarkable.

Pancreas: No focal abnormality or ductal dilatation.

Spleen: No focal abnormality.  Normal size.

Adrenals/Urinary Tract: Small cysts in the kidneys bilaterally. No
stones or hydronephrosis. Adrenal glands and urinary bladder
unremarkable.

Stomach/Bowel: Diffuse colonic diverticulosis. There is contrast
seen within the proximal sigmoid colon, likely active bleeding from
a diverticulum. No bowel obstruction. Stomach and small bowel
decompressed, grossly unremarkable.

Lymphatic: No adenopathy

Reproductive: Prostate enlargement

Other: No free fluid or free air.

Musculoskeletal: No acute bony abnormality.
IMPRESSION: VASCULAR

Contrast extravasation noted in the proximal sigmoid colon, likely
source of GI bleed from a diverticulum.

Aortic atherosclerosis.

NON-VASCULAR

Diffuse colonic diverticulosis.  No active diverticulitis.

Prostate enlargement.

## 2023-03-25 DIAGNOSIS — H35371 Puckering of macula, right eye: Secondary | ICD-10-CM | POA: Diagnosis not present

## 2023-04-08 DIAGNOSIS — D518 Other vitamin B12 deficiency anemias: Secondary | ICD-10-CM | POA: Diagnosis not present

## 2023-04-08 DIAGNOSIS — Z1331 Encounter for screening for depression: Secondary | ICD-10-CM | POA: Diagnosis not present

## 2023-04-08 DIAGNOSIS — E559 Vitamin D deficiency, unspecified: Secondary | ICD-10-CM | POA: Diagnosis not present

## 2023-04-08 DIAGNOSIS — I10 Essential (primary) hypertension: Secondary | ICD-10-CM | POA: Diagnosis not present

## 2023-04-08 DIAGNOSIS — Z125 Encounter for screening for malignant neoplasm of prostate: Secondary | ICD-10-CM | POA: Diagnosis not present

## 2023-04-08 DIAGNOSIS — E039 Hypothyroidism, unspecified: Secondary | ICD-10-CM | POA: Diagnosis not present

## 2023-04-08 DIAGNOSIS — N400 Enlarged prostate without lower urinary tract symptoms: Secondary | ICD-10-CM | POA: Diagnosis not present

## 2023-04-08 DIAGNOSIS — N401 Enlarged prostate with lower urinary tract symptoms: Secondary | ICD-10-CM | POA: Diagnosis not present

## 2023-04-08 DIAGNOSIS — Z6820 Body mass index (BMI) 20.0-20.9, adult: Secondary | ICD-10-CM | POA: Diagnosis not present

## 2023-04-08 DIAGNOSIS — R634 Abnormal weight loss: Secondary | ICD-10-CM | POA: Diagnosis not present

## 2023-04-08 DIAGNOSIS — Z0001 Encounter for general adult medical examination with abnormal findings: Secondary | ICD-10-CM | POA: Diagnosis not present

## 2023-04-26 DIAGNOSIS — R300 Dysuria: Secondary | ICD-10-CM | POA: Diagnosis not present

## 2023-06-07 ENCOUNTER — Ambulatory Visit: Payer: Medicare HMO | Admitting: Urology

## 2023-06-14 DIAGNOSIS — N401 Enlarged prostate with lower urinary tract symptoms: Secondary | ICD-10-CM | POA: Diagnosis not present

## 2023-06-14 DIAGNOSIS — R3915 Urgency of urination: Secondary | ICD-10-CM | POA: Diagnosis not present

## 2023-06-14 DIAGNOSIS — R351 Nocturia: Secondary | ICD-10-CM | POA: Diagnosis not present

## 2023-06-25 DIAGNOSIS — Z23 Encounter for immunization: Secondary | ICD-10-CM | POA: Diagnosis not present

## 2023-09-09 DIAGNOSIS — R351 Nocturia: Secondary | ICD-10-CM | POA: Diagnosis not present

## 2023-09-09 DIAGNOSIS — N401 Enlarged prostate with lower urinary tract symptoms: Secondary | ICD-10-CM | POA: Diagnosis not present

## 2024-03-19 DIAGNOSIS — R030 Elevated blood-pressure reading, without diagnosis of hypertension: Secondary | ICD-10-CM | POA: Diagnosis not present

## 2024-03-19 DIAGNOSIS — Z6820 Body mass index (BMI) 20.0-20.9, adult: Secondary | ICD-10-CM | POA: Diagnosis not present

## 2024-03-19 DIAGNOSIS — L03115 Cellulitis of right lower limb: Secondary | ICD-10-CM | POA: Diagnosis not present

## 2024-03-25 DIAGNOSIS — H35371 Puckering of macula, right eye: Secondary | ICD-10-CM | POA: Diagnosis not present

## 2024-04-09 ENCOUNTER — Ambulatory Visit

## 2024-04-09 VITALS — BP 145/68 | HR 79 | Ht 68.0 in | Wt 133.0 lb

## 2024-04-09 DIAGNOSIS — I10 Essential (primary) hypertension: Secondary | ICD-10-CM | POA: Diagnosis not present

## 2024-04-09 NOTE — Progress Notes (Signed)
 New Patient Office Visit  Subjective    Patient ID: Jaime [name], male    DOB: 02-26-1933  Age: 89 y.o. MRN: [id number]  CC:  Chief Complaint  Patient presents with   Establish Care    Pt here to establish care    HPI Jaime [name] presents to establish care   Outpatient Encounter Medications as of 04/09/2024  Medication Sig   acetaminophen  (TYLENOL ) 325 MG tablet Take 650 mg by mouth every 6 (six) hours as needed for headache or moderate pain.   amLODipine (NORVASC) 5 MG tablet Take 5 mg by mouth daily.   atorvastatin (LIPITOR) 40 MG tablet Take 40 mg by mouth daily.   finasteride (PROSCAR) 5 MG tablet Take 5 mg by mouth daily.   tamsulosin (FLOMAX) 0.4 MG CAPS capsule Take 0.4 mg by mouth daily.   [DISCONTINUED] ascorbic acid  (VITAMIN C) 500 MG tablet Take 1 tablet (500 mg total) by mouth daily. (Patient not taking: Reported on 04/09/2024)   [DISCONTINUED] ferrous sulfate  325 (65 FE) MG tablet Take 1 tablet (325 mg total) by mouth daily. (Patient not taking: Reported on 04/09/2024)   [DISCONTINUED] multivitamin (ONE-A-DAY MEN'S) TABS tablet Take 1 tablet by mouth daily. (Patient not taking: Reported on 04/09/2024)   [DISCONTINUED] zinc  sulfate 220 (50 Zn) MG capsule Take 1 capsule (220 mg total) by mouth daily. (Patient not taking: Reported on 04/09/2024)   No facility-administered encounter medications on file as of 04/09/2024.    Past Medical History:  Diagnosis Date   BPH (benign prostatic hyperplasia)    Coronary atherosclerosis of native coronary artery    Minimal coronary atherosclerosis at catheterization 2003   Diverticulosis of colon    Exertional dyspnea    History of colonic polyps    History of exercise stress test 05-31-2011   dr juventino   adequate but abnormal graded exercise test revealing reasonably good exercise capacity, no chest discomfort or other ischemic symptoms, borderline hypertensive blood pressure response and an ecg response consistent with  myocardial ishcemia , but with very rapid resolution after the cessation of exercise suggesting a possible false positive ecg response    History of kidney stones    Left ureteral stone    Mixed hyperlipidemia    Nephrolithiasis    Osteoarthritis    Wears glasses    Wears hearing aid in both ears    Wears partial dentures    upper only    Past Surgical History:  Procedure Laterality Date   CARDIAC CATHETERIZATION  02-27-2002  dr hochrein   minimal coronary plaquing , normal lvf   CATARACT EXTRACTION W/PHACO Left 06/27/2020   Procedure: CATARACT EXTRACTION PHACO AND INTRAOCULAR LENS PLACEMENT LEFT EYE;  Surgeon: Harrie Agent, MD;  Location: [facility];  Service: Ophthalmology;  Laterality: Left;  CDE:16.96   CATARACT EXTRACTION W/PHACO Right 07/11/2020   Procedure: CATARACT EXTRACTION PHACO AND INTRAOCULAR LENS PLACEMENT (IOC);  Surgeon: Harrie Agent, MD;  Location: [facility];  Service: Ophthalmology;  Laterality: Right;  CDE: 20.43   COLONOSCOPY WITH PROPOFOL  N/A 05/23/2021   Procedure: COLONOSCOPY WITH PROPOFOL ;  Surgeon: Charlanne Groom, MD;  Location: [facility];  Service: Endoscopy;  Laterality: N/A;   CYSTOSCOPY W/ URETERAL STENT PLACEMENT Left 09/23/2017   Procedure: CYSTOSCOPY WITH RETROGRADE PYELOGRAM/URETERAL LEFT STENT PLACEMENT;  Surgeon: Devere Lonni Righter, MD;  Location: [facility];  Service: Urology;  Laterality: Left;   CYSTOSCOPY/URETEROSCOPY/HOLMIUM LASER/STENT PLACEMENT Left 10/02/2017   Procedure: CYSTOSCOPY/URETEROSCOPY/HOLMIUM LASER/STENT PLACEMENT, STONE BASKETRY;  Surgeon: Devere Lonni Righter,  MD;  Location: [facility];  Service: Urology;  Laterality: Left;  ONLY NEEDS 45 MIN FOR PROCEDURE   EXTRACORPOREAL SHOCK WAVE LITHOTRIPSY  2002   IR ANGIOGRAM VISCERAL SELECTIVE  05/21/2021   IR US  GUIDE VASC ACCESS RIGHT  05/21/2021   POLYPECTOMY  05/23/2021   Procedure: POLYPECTOMY;  Surgeon: Charlanne Groom, MD;  Location: [facility];  Service: Endoscopy;;    TRANSTHORACIC ECHOCARDIOGRAM  05/31/2011   dr s. debera   mild concentric LVH, ef 60-65%/  moderately calcified AV w/ mild AV stenosis, no regurg. (mean grandient , peak gradient 33mmHg)/ trivial TR    Family History  Problem Relation Age of Onset   Cancer Father        Bone cancer   Thyroid  disease Neg Hx     Social History   Socioeconomic History   Marital status: Married    Spouse name: Arling Cerone   Number of children: 3   Years of education: Not on file   Highest education level: Not on file  Occupational History   Occupation: Retired    Associate Professor: RETIRED    Comment: Truck driver  Tobacco Use   Smoking status: Never   Smokeless tobacco: Current    Types: Chew   Tobacco comments:    chew tobacco for 30 years (as of 09-30-2017)  Vaping Use   Vaping status: Never Used  Substance and Sexual Activity   Alcohol use: No   Drug use: No   Sexual activity: Not on file  Other Topics Concern   Not on file  Social History Narrative   Not on file   Social Drivers of Health   Financial Resource Strain: Not on file  Food Insecurity: Not on file  Transportation Needs: Not on file  Physical Activity: Not on file  Stress: Not on file  Social Connections: Not on file  Intimate Partner Violence: Not on file    ROS      Objective    BP (!) 145/68   Pulse 79   Ht 5' 8 (1.727 m)   Wt 133 lb 0.6 oz (60.3 kg)   SpO2 94%   BMI 20.23 kg/m   Physical Exam Vitals and nursing note reviewed. Exam conducted with a chaperone present (daughter).  Constitutional:      Appearance: Normal appearance.  HENT:     Head: Normocephalic.     Comments: Wears bilateral hearing aids Eyes:     Extraocular Movements: Extraocular movements intact.     Pupils: Pupils are equal, round, and reactive to light.  Cardiovascular:     Rate and Rhythm: Normal rate and regular rhythm.  Pulmonary:     Effort: Pulmonary effort is normal.     Breath sounds: Normal breath sounds.   Abdominal:     General: Bowel sounds are normal.  Musculoskeletal:     Cervical back: Normal range of motion and neck supple.  Neurological:     Mental Status: He is alert and oriented to person, place, and time.  Psychiatric:        Mood and Affect: Mood normal.        Thought Content: Thought content normal.         Assessment & Plan:   Problem List Items Addressed This Visit       Cardiovascular and Mediastinum   Hypertension - Primary   Mildly elevated blood pressure in office today, but he reports stable readings at home.  Continue with amlodipine 5 mg daily.  Recommend low-sodium diet good oral hydration and regular exercise.  Recommend follow-up in 4 to 6 months or sooner if needed.      Relevant Medications   amLODipine (NORVASC) 5 MG tablet    Return in about 3 months (around 07/10/2024), or awv, for chronic follow-up with PCP.   Leita Longs, FNP

## 2024-04-17 NOTE — Assessment & Plan Note (Signed)
 Mildly elevated blood pressure in office today, but he reports stable readings at home.  Continue with amlodipine 5 mg daily.  Recommend low-sodium diet good oral hydration and regular exercise.  Recommend follow-up in 4 to 6 months or sooner if needed.

## 2024-06-24 ENCOUNTER — Ambulatory Visit (INDEPENDENT_AMBULATORY_CARE_PROVIDER_SITE_OTHER): Payer: Self-pay

## 2024-06-24 DIAGNOSIS — Z23 Encounter for immunization: Secondary | ICD-10-CM | POA: Diagnosis not present

## 2024-07-08 ENCOUNTER — Ambulatory Visit: Payer: Self-pay

## 2024-07-08 ENCOUNTER — Ambulatory Visit

## 2024-07-08 NOTE — Telephone Encounter (Signed)
 Patient's daughter reports that patient is having problems with his hearing. Patient says that his ears are completely stopped up. Daughter states family is having to yell at him so he can hear what they are saying. States patient recently was treated for URI symptoms. Daughter wanted to get patient checked out first before checking his hearing aids. Scheduled for 07/10/2024 at 10 AM.   FYI Only or Action Required?: FYI only for provider.  Patient was last seen in primary care on 04/09/2024 by Bevely Doffing, FNP.  Called Nurse Triage reporting Hearing Problem.  Symptoms began about a month ago.  Interventions attempted: Rest, hydration, or home remedies.  Symptoms are: unchanged.  Triage Disposition: See PCP Within 2 Weeks  Patient/caregiver understands and will follow disposition?: Yes  Copied from CRM [id number]. Topic: Clinical - Red Word Triage >> Jul 08, 2024 10:24 AM Harlene ORN wrote: Red Word that prompted transfer to Nurse Triage: problems with both ears being completely stopped up. even with his hearing aid, he cannot hear well. Has gotten worse with 2 weeks. Reason for Disposition  Decreased hearing following an ear infection  Answer Assessment - Initial Assessment Questions 1. DESCRIPTION: What type of hearing problem are you having? Describe it for me. (e.g., complete hearing loss, partial loss)     Daughter states patient's ears are stopped up. Feels like he is in a barrel.  2. LOCATION: One or both ears? If one, ask: Which ear?     Both ears. 3. SEVERITY: Can you hear anything? If Yes, ask: What can you hear? (e.g., ticking watch, whisper, talking)     Daughter states they are having to yell at patient for him to be able to hear them 4. ONSET: When did this begin? Did it start suddenly or come on gradually?     Last couple of weeks 5. PATTERN: Does this come and go, or has it been constant since it started?     constant 6. PAIN: Is there any pain in  your ear(s)?  (Scale 0-10; or none, mild, moderate, severe)     no 7. CAUSE: What do you think is causing this hearing problem?     Daughter is unable 8. OTHER SYMPTOMS: Do you have any other symptoms? (e.g., dizziness, ringing in ears)     no  Protocols used: Hearing Loss or Change-A-AH

## 2024-07-08 NOTE — Telephone Encounter (Signed)
Noted, patient scheduled.

## 2024-07-10 ENCOUNTER — Ambulatory Visit (INDEPENDENT_AMBULATORY_CARE_PROVIDER_SITE_OTHER): Payer: Self-pay

## 2024-07-10 VITALS — BP 100/57 | HR 91 | Ht 68.0 in | Wt 133.0 lb

## 2024-07-10 DIAGNOSIS — H6991 Unspecified Eustachian tube disorder, right ear: Secondary | ICD-10-CM

## 2024-07-10 DIAGNOSIS — H900 Conductive hearing loss, bilateral: Secondary | ICD-10-CM

## 2024-07-10 NOTE — Progress Notes (Signed)
   Established Patient Office Visit  Subjective   Patient ID: Jaime [name], male    DOB: 07/28/1933  Age: 89 y.o. MRN: [id number]  Chief Complaint  Patient presents with   Medical Management of Chronic Issues    Pt here due to hearing concerns that's been going on for a couple of weeks after he caught a cold, and his ears till not clearing up    HPI  Patient Active Problem List   Diagnosis Date Noted   Eustachian tube dysfunction, right 07/12/2024   Conductive hearing loss, bilateral 07/12/2024   GI bleed 05/22/2021   Bright red rectal bleeding 05/21/2021   Hypocalcemia 05/21/2021   Acute blood loss anemia 05/21/2021   Hypertension 05/21/2021   COVID-19 virus infection 05/21/2021   Hyperthyroidism 07/18/2019   Difficulty walking 03/26/2012   Elevated bilirubin 03/25/2012   Shortness of breath 05/16/2011   Undiagnosed cardiac murmurs 05/16/2011   Dizziness and giddiness 05/16/2011   Mixed hyperlipidemia 05/16/2011   Coronary atherosclerosis of native coronary artery 05/16/2011      ROS    Objective:     BP (!) 100/57   Pulse 91   Ht 5' 8 (1.727 m)   Wt 133 lb (60.3 kg)   SpO2 91%   BMI 20.22 kg/m  BP Readings from Last 3 Encounters:  07/10/24 (!) 100/57  04/09/24 (!) 145/68  10/16/21 130/66   Wt Readings from Last 3 Encounters:  07/10/24 133 lb (60.3 kg)  04/09/24 133 lb 0.6 oz (60.3 kg)  10/16/21 149 lb 6.4 oz (67.8 kg)     Physical Exam Vitals and nursing note reviewed.  Constitutional:      Appearance: Normal appearance.  HENT:     Head: Normocephalic.  Eyes:     Extraocular Movements: Extraocular movements intact.     Pupils: Pupils are equal, round, and reactive to light.  Cardiovascular:     Rate and Rhythm: Normal rate and regular rhythm.  Pulmonary:     Effort: Pulmonary effort is normal.     Breath sounds: Normal breath sounds.  Musculoskeletal:     Cervical back: Normal range of motion and neck supple.  Neurological:     Mental  Status: He is alert and oriented to person, place, and time.  Psychiatric:        Mood and Affect: Mood normal.        Thought Content: Thought content normal.      No results found for any visits on 07/10/24.    The ASCVD Risk score (Arnett DK, et al., 2019) failed to calculate for the following reasons:   The 2019 ASCVD risk score is only valid for ages 41 to 70    Assessment & Plan:   Problem List Items Addressed This Visit       Nervous and Auditory   Eustachian tube dysfunction, right - Primary   Fluid behind the right ear. - Prescribed Flonase nasal spray, 2 sprays in each nostril daily. - Instructed to look down at toes while spraying for proper administration.      Conductive hearing loss, bilateral   Chronic bilateral hearing loss with limited benefit from hearing aids, more pronounced in the right ear.      No follow-ups on file.    Leita Longs, FNP

## 2024-07-12 DIAGNOSIS — H6991 Unspecified Eustachian tube disorder, right ear: Secondary | ICD-10-CM | POA: Insufficient documentation

## 2024-07-12 DIAGNOSIS — H900 Conductive hearing loss, bilateral: Secondary | ICD-10-CM | POA: Insufficient documentation

## 2024-07-12 NOTE — Assessment & Plan Note (Signed)
 Chronic bilateral hearing loss with limited benefit from hearing aids, more pronounced in the right ear.

## 2024-07-12 NOTE — Assessment & Plan Note (Signed)
 Fluid behind the right ear. - Prescribed Flonase nasal spray, 2 sprays in each nostril daily. - Instructed to look down at toes while spraying for proper administration.

## 2024-07-27 DIAGNOSIS — H90A32 Mixed conductive and sensorineural hearing loss, unilateral, left ear with restricted hearing on the contralateral side: Secondary | ICD-10-CM | POA: Diagnosis not present

## 2024-07-27 DIAGNOSIS — H6502 Acute serous otitis media, left ear: Secondary | ICD-10-CM | POA: Diagnosis not present

## 2024-07-27 DIAGNOSIS — J342 Deviated nasal septum: Secondary | ICD-10-CM | POA: Diagnosis not present

## 2024-08-20 DIAGNOSIS — H90A32 Mixed conductive and sensorineural hearing loss, unilateral, left ear with restricted hearing on the contralateral side: Secondary | ICD-10-CM | POA: Diagnosis not present

## 2024-08-20 DIAGNOSIS — H6502 Acute serous otitis media, left ear: Secondary | ICD-10-CM | POA: Diagnosis not present

## 2024-09-08 ENCOUNTER — Ambulatory Visit

## 2024-09-08 VITALS — Ht 68.0 in | Wt 133.0 lb

## 2024-09-08 DIAGNOSIS — Z0000 Encounter for general adult medical examination without abnormal findings: Secondary | ICD-10-CM

## 2024-09-08 NOTE — Progress Notes (Signed)
 "  No voiced or noted concerns at this time  Chief Complaint  Patient presents with   Medicare Wellness     Subjective:   Jaime [name] is a 89 y.o. male who presents for a Medicare Annual Wellness Visit.  Visit info / Clinical Intake: Medicare Wellness Visit Type:: Subsequent Annual Wellness Visit Persons participating in visit and providing information:: patient Medicare Wellness Visit Mode:: Telephone If telephone:: video error Since this visit was completed virtually, some vitals may be partially provided or unavailable. Missing vitals are due to the limitations of the virtual format.: Documented vitals are patient reported If Telephone or Video please confirm:: I connected with patient using audio/video enable telemedicine. I verified patient identity with two identifiers, discussed telehealth limitations, and patient agreed to proceed. Patient Location:: home Provider Location:: office Interpreter Needed?: No Pre-visit prep was completed: yes AWV questionnaire completed by patient prior to visit?: yes Date:: 09/07/24 Living arrangements:: (Patient-Rptd) lives with spouse/significant other Patient's Overall Health Status Rating: (!) (Patient-Rptd) fair Typical amount of pain: (Patient-Rptd) none Does pain affect daily life?: (Patient-Rptd) no Are you currently prescribed opioids?: no  Dietary Habits and Nutritional Risks How many meals a day?: (Patient-Rptd) 3 Eats fruit and vegetables daily?: (Patient-Rptd) yes Most meals are obtained by: (Patient-Rptd) having others provide food In the last 2 weeks, have you had any of the following?: none Diabetic:: no  Functional Status Activities of Daily Living (to include ambulation/medication): (Patient-Rptd) Independent Ambulation: (Patient-Rptd) Independent Medication Administration: (Patient-Rptd) Independent Home Management (perform basic housework or laundry): (Patient-Rptd) Independent Manage your own finances?:  (Patient-Rptd) yes Primary transportation is: (Patient-Rptd) driving  Fall Screening Falls in the past year?: (Patient-Rptd) 0 Number of falls in past year: 0 Was there an injury with Fall?: 0 Fall Risk Category Calculator: 0 Patient Fall Risk Level: Low Fall Risk  Fall Risk Patient at Risk for Falls Due to: No Fall Risks Fall risk Follow up: Falls evaluation completed; Education provided; Falls prevention discussed  Home and Transportation Safety: All rugs have non-skid backing?: (Patient-Rptd) yes All stairs or steps have railings?: (Patient-Rptd) yes Grab bars in the bathtub or shower?: (Patient-Rptd) yes Have non-skid surface in bathtub or shower?: (Patient-Rptd) yes Good home lighting?: (Patient-Rptd) yes Regular seat belt use?: (Patient-Rptd) yes Hospital stays in the last year:: (Patient-Rptd) no  Cognitive Assessment Difficulty concentrating, remembering, or making decisions? : (Patient-Rptd) no Will 6CIT or Mini Cog be Completed: yes What year is it?: 0 points What month is it?: 0 points Give patient an address phrase to remember (5 components): [address] [place] About what time is it?: 0 points Count backwards from 20 to 1: 0 points Say the months of the year in reverse: 0 points Repeat the address phrase from earlier: 0 points 6 CIT Score: 0 points  Advance Directives (For Healthcare) Does Patient Have a Medical Advance Directive?: No Would patient like information on creating a medical advance directive?: Yes (MAU/Ambulatory/Procedural Areas - Information given)  Reviewed/Updated  Reviewed/Updated: Reviewed All (Medical, Surgical, Family, Medications, Allergies, Care Teams, Patient Goals)    Allergies (verified) Patient has no known allergies.   Current Medications (verified) Outpatient Encounter Medications as of 09/08/2024  Medication Sig   acetaminophen  (TYLENOL ) 325 MG tablet Take 650 mg by mouth every 6 (six) hours as needed for headache or  moderate pain.   amLODipine (NORVASC) 5 MG tablet Take 5 mg by mouth daily.   atorvastatin (LIPITOR) 40 MG tablet Take 40 mg by mouth daily.  finasteride (PROSCAR) 5 MG tablet Take 5 mg by mouth daily.   tamsulosin (FLOMAX) 0.4 MG CAPS capsule Take 0.4 mg by mouth daily.   No facility-administered encounter medications on file as of 09/08/2024.    History: Past Medical History:  Diagnosis Date   BPH (benign prostatic hyperplasia)    Coronary atherosclerosis of native coronary artery    Minimal coronary atherosclerosis at catheterization 2003   Diverticulosis of colon    Exertional dyspnea    History of colonic polyps    History of exercise stress test 05-31-2011   dr juventino   adequate but abnormal graded exercise test revealing reasonably good exercise capacity, no chest discomfort or other ischemic symptoms, borderline hypertensive blood pressure response and an ecg response consistent with myocardial ishcemia , but with very rapid resolution after the cessation of exercise suggesting a possible false positive ecg response    History of kidney stones    Left ureteral stone    Mixed hyperlipidemia    Nephrolithiasis    Osteoarthritis    Wears glasses    Wears hearing aid in both ears    Wears partial dentures    upper only   Past Surgical History:  Procedure Laterality Date   CARDIAC CATHETERIZATION  02-27-2002  dr hochrein   minimal coronary plaquing , normal lvf   CATARACT EXTRACTION W/PHACO Left 06/27/2020   Procedure: CATARACT EXTRACTION PHACO AND INTRAOCULAR LENS PLACEMENT LEFT EYE;  Surgeon: Harrie Agent, MD;  Location: [facility];  Service: Ophthalmology;  Laterality: Left;  CDE:16.96   CATARACT EXTRACTION W/PHACO Right 07/11/2020   Procedure: CATARACT EXTRACTION PHACO AND INTRAOCULAR LENS PLACEMENT (IOC);  Surgeon: Harrie Agent, MD;  Location: [facility];  Service: Ophthalmology;  Laterality: Right;  CDE: 20.43   COLONOSCOPY WITH PROPOFOL  N/A 05/23/2021   Procedure:  COLONOSCOPY WITH PROPOFOL ;  Surgeon: Charlanne Groom, MD;  Location: [facility];  Service: Endoscopy;  Laterality: N/A;   CYSTOSCOPY W/ URETERAL STENT PLACEMENT Left 09/23/2017   Procedure: CYSTOSCOPY WITH RETROGRADE PYELOGRAM/URETERAL LEFT STENT PLACEMENT;  Surgeon: Devere Lonni Righter, MD;  Location: [facility];  Service: Urology;  Laterality: Left;   CYSTOSCOPY/URETEROSCOPY/HOLMIUM LASER/STENT PLACEMENT Left 10/02/2017   Procedure: CYSTOSCOPY/URETEROSCOPY/HOLMIUM LASER/STENT PLACEMENT, STONE BASKETRY;  Surgeon: Devere Lonni Righter, MD;  Location: [facility];  Service: Urology;  Laterality: Left;  ONLY NEEDS 45 MIN FOR PROCEDURE   EXTRACORPOREAL SHOCK WAVE LITHOTRIPSY  2002   IR ANGIOGRAM VISCERAL SELECTIVE  05/21/2021   IR US  GUIDE VASC ACCESS RIGHT  05/21/2021   POLYPECTOMY  05/23/2021   Procedure: POLYPECTOMY;  Surgeon: Charlanne Groom, MD;  Location: [facility] ENDOSCOPY;  Service: Endoscopy;;   TRANSTHORACIC ECHOCARDIOGRAM  05/31/2011   dr s. debera   mild concentric LVH, ef 60-65%/  moderately calcified AV w/ mild AV stenosis, no regurg. (mean grandient , peak gradient 38mmHg)/ trivial TR   Family History  Problem Relation Age of Onset   Cancer Father        Bone cancer   Thyroid  disease Neg Hx    Social History   Occupational History   Occupation: Retired    Associate Professor: RETIRED    Comment: Truck driver  Tobacco Use   Smoking status: Never   Smokeless tobacco: Current    Types: Chew   Tobacco comments:    chew tobacco for 30 years (as of 09-30-2017)  Vaping Use   Vaping status: Never Used  Substance and Sexual Activity   Alcohol use: Never   Drug use: Never   Sexual  activity: Not Currently   Tobacco Counseling Ready to quit: No Counseling given: Yes Tobacco comments: chew tobacco for 30 years (as of 09-30-2017)  SDOH Screenings   Food Insecurity: No Food Insecurity (09/07/2024)  Housing: Low Risk (09/07/2024)  Transportation Needs: No Transportation  Needs (09/07/2024)  Utilities: Not At Risk (09/08/2024)  Depression (PHQ2-9): Low Risk (09/08/2024)  Financial Resource Strain: Low Risk (09/07/2024)  Physical Activity: Inactive (09/07/2024)  Social Connections: Socially Integrated (09/07/2024)  Stress: No Stress Concern Present (09/07/2024)  Tobacco Use: High Risk (09/08/2024)  Health Literacy: Adequate Health Literacy (09/08/2024)   See flowsheets for full screening details  Depression Screen PHQ 2 & 9 Depression Scale- Over the past 2 weeks, how often have you been bothered by any of the following problems? Little interest or pleasure in doing things: 0 Feeling down, depressed, or hopeless (PHQ Adolescent also includes...irritable): 0 PHQ-2 Total Score: 0 Trouble falling or staying asleep, or sleeping too much: 0 Feeling tired or having little energy: 0 Poor appetite or overeating (PHQ Adolescent also includes...weight loss): 1 Feeling bad about yourself - or that you are a failure or have let yourself or your family down: 0 Trouble concentrating on things, such as reading the newspaper or watching television (PHQ Adolescent also includes...like school work): 0 Moving or speaking so slowly that other people could have noticed. Or the opposite - being so fidgety or restless that you have been moving around a lot more than usual: 0 Thoughts that you would be better off dead, or of hurting yourself in some way: 0 PHQ-9 Total Score: 1 If you checked off any problems, how difficult have these problems made it for you to do your work, take care of things at home, or get along with other people?: Not difficult at all     Goals Addressed               This Visit's Progress     Remain active and healthy and continue to mow yards and gather wood (pt-stated)               Objective:    Today's Vitals   09/08/24 1257  Weight: 133 lb (60.3 kg)  Height: 5' 8 (1.727 m)   Body mass index is 20.22 kg/m.  Hearing/Vision  screen Hearing Screening - Comments:: Patient wears hearing aids but still has difficulty hearing. Patient is up to date with ENT visits.   Immunizations and Health Maintenance Health Maintenance  Topic Date Due   DTaP/Tdap/Td (1 - Tdap) Never done   Pneumococcal Vaccine: 50+ Years (1 of 2 - PCV) Never done   Zoster Vaccines- Shingrix (1 of 2) Never done   Medicare Annual Wellness (AWV)  03/30/2023   COVID-19 Vaccine (1 - 2025-26 season) Never done   Influenza Vaccine  Completed   Meningococcal B Vaccine  Aged Out        Assessment/Plan:  This is a routine wellness examination for Albi.  Patient Care Team: Bevely Doffing, FNP as PCP - General (Family Medicine) Darroll Anes, DO (Optometry) Maggie Hussar, MD as Consulting Physician (Otolaryngology) Devere Lonni Righter, MD as Consulting Physician (Urology)  I have personally reviewed and noted the following in the patients chart:   Medical and social history Use of alcohol, tobacco or illicit drugs  Current medications and supplements including opioid prescriptions. Functional ability and status Nutritional status Physical activity Advanced directives List of other physicians Hospitalizations, surgeries, and ER visits in previous 12 months Vitals Screenings to  include cognitive, depression, and falls Referrals and appointments  No orders of the defined types were placed in this encounter.  In addition, I have reviewed and discussed with patient certain preventive protocols, quality metrics, and best practice recommendations. A written personalized care plan for preventive services as well as general preventive health recommendations were provided to patient.   Sunya Humbarger, CMA   09/08/2024   Return on Monday September 13, 2025 at 11:20 am, for your yearly Medicare Wellness Visit in person.  After Visit Summary: (MyChart) Due to this being a telephonic visit, the after visit summary with patients  personalized plan was offered to patient via MyChart   "

## 2024-09-08 NOTE — Patient Instructions (Signed)
 Jaime [name],  Thank you for taking the time for your Medicare Wellness Visit. I appreciate your continued commitment to your health goals. Please review the care plan we discussed, and feel free to reach out if I can assist you further.  Please note that Annual Wellness Visits do not include a physical exam. Some assessments may be limited, especially if the visit was conducted virtually. If needed, we may recommend an in-person follow-up with your provider.  Ongoing Care Seeing your primary care provider every 3 to 6 months helps us  monitor your health and provide consistent, personalized care.   1 year follow up for Medicare well visit: Monday September 13, 2025 at 11:20 am with medicare wellness nurse in office  Recommended Screenings:  Health Maintenance  Topic Date Due   DTaP/Tdap/Td vaccine (1 - Tdap) Never done   Pneumococcal Vaccine for age over 28 (1 of 2 - PCV) Never done   Zoster (Shingles) Vaccine (1 of 2) Never done   Medicare Annual Wellness Visit  03/30/2023   COVID-19 Vaccine (1 - 2025-26 season) Never done   Flu Shot  Completed   Meningitis B Vaccine  Aged Out       09/07/2024   11:30 AM  Advanced Directives  Does Patient Have a Medical Advance Directive? No  Would patient like information on creating a medical advance directive? Yes (MAU/Ambulatory/Procedural Areas - Information given)    Vision: Annual vision screenings are recommended for early detection of glaucoma, cataracts, and diabetic retinopathy. These exams can also reveal signs of chronic conditions such as diabetes and high blood pressure.  Dental: Annual dental screenings help detect early signs of oral cancer, gum disease, and other conditions linked to overall health, including heart disease and diabetes.  Please see the attached documents for additional preventive care recommendations.

## 2025-09-13 ENCOUNTER — Ambulatory Visit: Payer: Self-pay
